# Patient Record
Sex: Female | Born: 1965 | Hispanic: No | Smoking: Current every day smoker
Health system: Southern US, Community
[De-identification: ages and names within clinical notes are randomized; demographics above are authoritative.]

## PROBLEM LIST (undated history)

## (undated) DIAGNOSIS — J449 Chronic obstructive pulmonary disease, unspecified: Secondary | ICD-10-CM

## (undated) DIAGNOSIS — R519 Headache, unspecified: Secondary | ICD-10-CM

## (undated) DIAGNOSIS — F319 Bipolar disorder, unspecified: Secondary | ICD-10-CM

## (undated) DIAGNOSIS — J45909 Unspecified asthma, uncomplicated: Secondary | ICD-10-CM

## (undated) DIAGNOSIS — F410 Panic disorder [episodic paroxysmal anxiety] without agoraphobia: Secondary | ICD-10-CM

## (undated) DIAGNOSIS — K219 Gastro-esophageal reflux disease without esophagitis: Secondary | ICD-10-CM

## (undated) DIAGNOSIS — F329 Major depressive disorder, single episode, unspecified: Secondary | ICD-10-CM

## (undated) DIAGNOSIS — H269 Unspecified cataract: Secondary | ICD-10-CM

## (undated) DIAGNOSIS — F32A Depression, unspecified: Secondary | ICD-10-CM

## (undated) DIAGNOSIS — D219 Benign neoplasm of connective and other soft tissue, unspecified: Secondary | ICD-10-CM

## (undated) DIAGNOSIS — D649 Anemia, unspecified: Secondary | ICD-10-CM

## (undated) DIAGNOSIS — O039 Complete or unspecified spontaneous abortion without complication: Secondary | ICD-10-CM

## (undated) DIAGNOSIS — R000 Tachycardia, unspecified: Secondary | ICD-10-CM

## (undated) DIAGNOSIS — Z5189 Encounter for other specified aftercare: Secondary | ICD-10-CM

## (undated) DIAGNOSIS — R51 Headache: Secondary | ICD-10-CM

## (undated) DIAGNOSIS — G4700 Insomnia, unspecified: Secondary | ICD-10-CM

## (undated) DIAGNOSIS — F419 Anxiety disorder, unspecified: Secondary | ICD-10-CM

## (undated) DIAGNOSIS — M1990 Unspecified osteoarthritis, unspecified site: Secondary | ICD-10-CM

## (undated) HISTORY — PX: DILATION AND CURETTAGE OF UTERUS: SHX78

## (undated) HISTORY — DX: Unspecified osteoarthritis, unspecified site: M19.90

## (undated) HISTORY — DX: Anxiety disorder, unspecified: F41.9

## (undated) HISTORY — DX: Benign neoplasm of connective and other soft tissue, unspecified: D21.9

## (undated) HISTORY — PX: OTHER SURGICAL HISTORY: SHX169

## (undated) HISTORY — DX: Panic disorder (episodic paroxysmal anxiety): F41.0

---

## 1999-04-02 ENCOUNTER — Emergency Department (HOSPITAL_COMMUNITY): Admission: EM | Admit: 1999-04-02 | Discharge: 1999-04-02 | Payer: Self-pay | Admitting: Emergency Medicine

## 2000-12-10 ENCOUNTER — Emergency Department (HOSPITAL_COMMUNITY): Admission: EM | Admit: 2000-12-10 | Discharge: 2000-12-10 | Payer: Self-pay | Admitting: Emergency Medicine

## 2001-03-15 ENCOUNTER — Emergency Department (HOSPITAL_COMMUNITY): Admission: EM | Admit: 2001-03-15 | Discharge: 2001-03-15 | Payer: Self-pay | Admitting: Emergency Medicine

## 2001-03-15 ENCOUNTER — Encounter: Payer: Self-pay | Admitting: Emergency Medicine

## 2001-04-02 ENCOUNTER — Encounter: Payer: Self-pay | Admitting: Internal Medicine

## 2001-04-02 ENCOUNTER — Emergency Department (HOSPITAL_COMMUNITY): Admission: EM | Admit: 2001-04-02 | Discharge: 2001-04-02 | Payer: Self-pay | Admitting: Emergency Medicine

## 2001-04-04 ENCOUNTER — Inpatient Hospital Stay (HOSPITAL_COMMUNITY): Admission: AD | Admit: 2001-04-04 | Discharge: 2001-04-04 | Payer: Self-pay | Admitting: Obstetrics

## 2001-04-04 ENCOUNTER — Encounter: Payer: Self-pay | Admitting: Obstetrics & Gynecology

## 2001-04-05 ENCOUNTER — Inpatient Hospital Stay (HOSPITAL_COMMUNITY): Admission: AD | Admit: 2001-04-05 | Discharge: 2001-04-05 | Payer: Self-pay | Admitting: Obstetrics

## 2002-01-09 ENCOUNTER — Emergency Department (HOSPITAL_COMMUNITY): Admission: EM | Admit: 2002-01-09 | Discharge: 2002-01-10 | Payer: Self-pay | Admitting: *Deleted

## 2002-12-10 ENCOUNTER — Emergency Department (HOSPITAL_COMMUNITY): Admission: EM | Admit: 2002-12-10 | Discharge: 2002-12-10 | Payer: Self-pay | Admitting: Emergency Medicine

## 2003-03-31 ENCOUNTER — Emergency Department (HOSPITAL_COMMUNITY): Admission: EM | Admit: 2003-03-31 | Discharge: 2003-03-31 | Payer: Self-pay | Admitting: Emergency Medicine

## 2003-03-31 ENCOUNTER — Encounter: Payer: Self-pay | Admitting: Emergency Medicine

## 2003-04-21 ENCOUNTER — Emergency Department (HOSPITAL_COMMUNITY): Admission: AC | Admit: 2003-04-21 | Discharge: 2003-04-21 | Payer: Self-pay

## 2003-04-21 ENCOUNTER — Encounter: Payer: Self-pay | Admitting: Emergency Medicine

## 2003-04-21 ENCOUNTER — Emergency Department (HOSPITAL_COMMUNITY): Admission: EM | Admit: 2003-04-21 | Discharge: 2003-04-21 | Payer: Self-pay

## 2003-05-10 ENCOUNTER — Emergency Department (HOSPITAL_COMMUNITY): Admission: EM | Admit: 2003-05-10 | Discharge: 2003-05-10 | Payer: Self-pay | Admitting: Emergency Medicine

## 2003-07-17 ENCOUNTER — Encounter: Admission: RE | Admit: 2003-07-17 | Discharge: 2003-08-20 | Payer: Self-pay | Admitting: Specialist

## 2003-10-20 ENCOUNTER — Inpatient Hospital Stay (HOSPITAL_COMMUNITY): Admission: EM | Admit: 2003-10-20 | Discharge: 2003-10-22 | Payer: Self-pay | Admitting: Psychiatry

## 2004-04-03 ENCOUNTER — Emergency Department (HOSPITAL_COMMUNITY): Admission: EM | Admit: 2004-04-03 | Discharge: 2004-04-03 | Payer: Self-pay | Admitting: Emergency Medicine

## 2005-01-01 ENCOUNTER — Emergency Department (HOSPITAL_COMMUNITY): Admission: EM | Admit: 2005-01-01 | Discharge: 2005-01-01 | Payer: Self-pay | Admitting: Emergency Medicine

## 2006-10-08 ENCOUNTER — Emergency Department (HOSPITAL_COMMUNITY): Admission: EM | Admit: 2006-10-08 | Discharge: 2006-10-09 | Payer: Self-pay | Admitting: Emergency Medicine

## 2007-02-20 ENCOUNTER — Emergency Department (HOSPITAL_COMMUNITY): Admission: EM | Admit: 2007-02-20 | Discharge: 2007-02-20 | Payer: Self-pay | Admitting: Emergency Medicine

## 2007-12-10 ENCOUNTER — Emergency Department (HOSPITAL_COMMUNITY): Admission: EM | Admit: 2007-12-10 | Discharge: 2007-12-10 | Payer: Self-pay | Admitting: Emergency Medicine

## 2008-10-04 ENCOUNTER — Emergency Department (HOSPITAL_COMMUNITY): Admission: EM | Admit: 2008-10-04 | Discharge: 2008-10-04 | Payer: Self-pay | Admitting: Emergency Medicine

## 2009-02-28 ENCOUNTER — Emergency Department (HOSPITAL_COMMUNITY): Admission: EM | Admit: 2009-02-28 | Discharge: 2009-02-28 | Payer: Self-pay | Admitting: Emergency Medicine

## 2009-03-15 ENCOUNTER — Emergency Department (HOSPITAL_COMMUNITY): Admission: EM | Admit: 2009-03-15 | Discharge: 2009-03-15 | Payer: Self-pay | Admitting: Emergency Medicine

## 2009-03-27 ENCOUNTER — Emergency Department (HOSPITAL_COMMUNITY): Admission: EM | Admit: 2009-03-27 | Discharge: 2009-03-28 | Payer: Self-pay | Admitting: Emergency Medicine

## 2009-03-28 ENCOUNTER — Ambulatory Visit: Payer: Self-pay | Admitting: Psychiatry

## 2009-03-28 ENCOUNTER — Inpatient Hospital Stay (HOSPITAL_COMMUNITY): Admission: RE | Admit: 2009-03-28 | Discharge: 2009-03-31 | Payer: Self-pay | Admitting: Psychiatry

## 2009-04-15 ENCOUNTER — Emergency Department (HOSPITAL_COMMUNITY): Admission: EM | Admit: 2009-04-15 | Discharge: 2009-04-15 | Payer: Self-pay | Admitting: Emergency Medicine

## 2009-04-15 ENCOUNTER — Ambulatory Visit: Payer: Self-pay | Admitting: Psychiatry

## 2009-04-15 ENCOUNTER — Inpatient Hospital Stay (HOSPITAL_COMMUNITY): Admission: RE | Admit: 2009-04-15 | Discharge: 2009-04-16 | Payer: Self-pay | Admitting: Psychiatry

## 2009-04-23 ENCOUNTER — Ambulatory Visit: Payer: Self-pay | Admitting: Family Medicine

## 2009-05-08 ENCOUNTER — Ambulatory Visit: Payer: Self-pay | Admitting: *Deleted

## 2009-05-12 ENCOUNTER — Ambulatory Visit (HOSPITAL_COMMUNITY): Admission: RE | Admit: 2009-05-12 | Discharge: 2009-05-12 | Payer: Self-pay | Admitting: Internal Medicine

## 2009-08-26 ENCOUNTER — Emergency Department (HOSPITAL_COMMUNITY): Admission: EM | Admit: 2009-08-26 | Discharge: 2009-08-26 | Payer: Self-pay | Admitting: Emergency Medicine

## 2010-04-29 ENCOUNTER — Emergency Department (HOSPITAL_COMMUNITY): Admission: EM | Admit: 2010-04-29 | Discharge: 2010-04-29 | Payer: Self-pay | Admitting: Emergency Medicine

## 2010-10-14 LAB — URINALYSIS, ROUTINE W REFLEX MICROSCOPIC
Nitrite: NEGATIVE
Specific Gravity, Urine: 1.022 (ref 1.005–1.030)
Urobilinogen, UA: 0.2 mg/dL (ref 0.0–1.0)

## 2010-10-14 LAB — WET PREP, GENITAL
Trich, Wet Prep: NONE SEEN
Yeast Wet Prep HPF POC: NONE SEEN

## 2010-10-27 ENCOUNTER — Emergency Department (HOSPITAL_COMMUNITY)
Admission: EM | Admit: 2010-10-27 | Discharge: 2010-10-27 | Disposition: A | Discharge status: Home / Self Care | Payer: Self-pay | Attending: Emergency Medicine | Admitting: Emergency Medicine

## 2010-10-27 DIAGNOSIS — L259 Unspecified contact dermatitis, unspecified cause: Secondary | ICD-10-CM | POA: Insufficient documentation

## 2010-10-30 LAB — RAPID URINE DRUG SCREEN, HOSP PERFORMED
Amphetamines: NOT DETECTED
Amphetamines: NOT DETECTED
Barbiturates: NOT DETECTED
Barbiturates: NOT DETECTED
Benzodiazepines: POSITIVE — AB
Cocaine: NOT DETECTED
Opiates: NOT DETECTED
Tetrahydrocannabinol: NOT DETECTED
Tetrahydrocannabinol: NOT DETECTED

## 2010-10-30 LAB — SALICYLATE LEVEL: Salicylate Lvl: <4 mg/dL (ref 2.8–20.0)

## 2010-10-30 LAB — COMPREHENSIVE METABOLIC PANEL
ALT: 38 U/L — ABNORMAL HIGH (ref 0–35)
Albumin: 4 g/dL (ref 3.5–5.2)
Alkaline Phosphatase: 78 U/L (ref 39–117)
BUN: 5 mg/dL — ABNORMAL LOW (ref 6–23)
Chloride: 108 mEq/L (ref 96–112)
Glucose, Bld: 107 mg/dL — ABNORMAL HIGH (ref 70–99)
Potassium: 3.5 mEq/L (ref 3.5–5.1)
Sodium: 139 mEq/L (ref 135–145)
Total Bilirubin: 0.4 mg/dL (ref 0.3–1.2)

## 2010-10-30 LAB — ETHANOL
Alcohol, Ethyl (B): 278 mg/dL — ABNORMAL HIGH (ref 0–10)
Alcohol, Ethyl (B): 341 mg/dL — ABNORMAL HIGH (ref 0–10)

## 2010-10-30 LAB — ACETAMINOPHEN LEVEL: Acetaminophen (Tylenol), Serum: <10 ug/mL — ABNORMAL LOW (ref 10–30)

## 2010-10-30 LAB — POCT I-STAT, CHEM 8
BUN: 6 mg/dL (ref 6–23)
Calcium, Ion: 0.93 mmol/L — ABNORMAL LOW (ref 1.12–1.32)
Creatinine, Ser: 0.8 mg/dL (ref 0.4–1.2)
Hemoglobin: 15.6 g/dL — ABNORMAL HIGH (ref 12.0–15.0)
Sodium: 147 mEq/L — ABNORMAL HIGH (ref 135–145)
TCO2: 25 mmol/L (ref 0–100)

## 2010-10-30 LAB — URINALYSIS, ROUTINE W REFLEX MICROSCOPIC
Bilirubin Urine: NEGATIVE
Ketones, ur: NEGATIVE mg/dL
Nitrite: NEGATIVE
Nitrite: NEGATIVE
Protein, ur: NEGATIVE mg/dL
Protein, ur: NEGATIVE mg/dL
Specific Gravity, Urine: 1.005 (ref 1.005–1.030)
Urobilinogen, UA: 0.2 mg/dL (ref 0.0–1.0)

## 2010-10-30 LAB — CBC
HCT: 41.2 % (ref 36.0–46.0)
Hemoglobin: 13.4 g/dL (ref 12.0–15.0)
Platelets: 452 10*3/uL — ABNORMAL HIGH (ref 150–400)
WBC: 11.5 10*3/uL — ABNORMAL HIGH (ref 4.0–10.5)

## 2010-10-30 LAB — URINE MICROSCOPIC-ADD ON

## 2010-10-30 LAB — DIFFERENTIAL
Basophils Absolute: 0.1 10*3/uL (ref 0.0–0.1)
Eosinophils Absolute: 0.1 10*3/uL (ref 0.0–0.7)
Lymphocytes Relative: 24 % (ref 12–46)
Monocytes Absolute: 0.9 10*3/uL (ref 0.1–1.0)
Neutro Abs: 7.6 10*3/uL (ref 1.7–7.7)

## 2010-10-30 LAB — POCT PREGNANCY, URINE: Preg Test, Ur: NEGATIVE

## 2010-10-31 LAB — URINALYSIS, ROUTINE W REFLEX MICROSCOPIC
Glucose, UA: NEGATIVE mg/dL
Hgb urine dipstick: NEGATIVE
Ketones, ur: NEGATIVE mg/dL
Nitrite: NEGATIVE
Protein, ur: NEGATIVE mg/dL
Specific Gravity, Urine: 1.006 (ref 1.005–1.030)
Urobilinogen, UA: 0.2 mg/dL (ref 0.0–1.0)
Urobilinogen, UA: 0.2 mg/dL (ref 0.0–1.0)

## 2010-10-31 LAB — POCT I-STAT, CHEM 8
BUN: 4 mg/dL — ABNORMAL LOW (ref 6–23)
Calcium, Ion: 1 mmol/L — ABNORMAL LOW (ref 1.12–1.32)
Calcium, Ion: 0.95 mmol/L — ABNORMAL LOW (ref 1.12–1.32)
Creatinine, Ser: 0.6 mg/dL (ref 0.4–1.2)
Glucose, Bld: 111 mg/dL — ABNORMAL HIGH (ref 70–99)
HCT: 44 % (ref 36.0–46.0)
Hemoglobin: 15 g/dL (ref 12.0–15.0)
Hemoglobin: 15 g/dL (ref 12.0–15.0)
Sodium: 145 mEq/L (ref 135–145)
TCO2: 20 mmol/L (ref 0–100)
TCO2: 18 mmol/L (ref 0–100)

## 2010-10-31 LAB — RAPID URINE DRUG SCREEN, HOSP PERFORMED
Amphetamines: NOT DETECTED
Amphetamines: NOT DETECTED
Barbiturates: NOT DETECTED
Benzodiazepines: POSITIVE — AB
Benzodiazepines: POSITIVE — AB
Tetrahydrocannabinol: NOT DETECTED

## 2010-10-31 LAB — URINE MICROSCOPIC-ADD ON

## 2010-10-31 LAB — DIFFERENTIAL
Basophils Absolute: 0.2 10*3/uL — ABNORMAL HIGH (ref 0.0–0.1)
Basophils Relative: 2 % — ABNORMAL HIGH (ref 0–1)
Eosinophils Absolute: 0.1 10*3/uL (ref 0.0–0.7)
Eosinophils Absolute: 0.2 10*3/uL (ref 0.0–0.7)
Eosinophils Relative: 3 % (ref 0–5)
Lymphocytes Relative: 63 % — ABNORMAL HIGH (ref 12–46)
Lymphs Abs: 3.5 10*3/uL (ref 0.7–4.0)
Monocytes Absolute: 0.4 10*3/uL (ref 0.1–1.0)
Neutrophils Relative %: 36 % — ABNORMAL LOW (ref 43–77)
Neutrophils Relative %: 26 % — ABNORMAL LOW (ref 43–77)

## 2010-10-31 LAB — CBC
HCT: 40.2 % (ref 36.0–46.0)
MCHC: 33.3 g/dL (ref 30.0–36.0)
MCHC: 31.8 g/dL (ref 30.0–36.0)
MCV: 90.4 fL (ref 78.0–100.0)
Platelets: 470 10*3/uL — ABNORMAL HIGH (ref 150–400)
RBC: 4.61 MIL/uL (ref 3.87–5.11)
RDW: 22.8 % — ABNORMAL HIGH (ref 11.5–15.5)
WBC: 6.5 10*3/uL (ref 4.0–10.5)

## 2010-10-31 LAB — POCT CARDIAC MARKERS

## 2010-10-31 LAB — POCT PREGNANCY, URINE: Preg Test, Ur: NEGATIVE

## 2010-11-05 LAB — POCT CARDIAC MARKERS

## 2010-11-05 LAB — DIFFERENTIAL
Basophils Absolute: 0 10*3/uL (ref 0.0–0.1)
Basophils Relative: 1 % (ref 0–1)
Eosinophils Absolute: 0.1 10*3/uL (ref 0.0–0.7)
Eosinophils Relative: 2 % (ref 0–5)
Lymphocytes Relative: 30 % (ref 12–46)

## 2010-11-05 LAB — COMPREHENSIVE METABOLIC PANEL
ALT: 132 U/L — ABNORMAL HIGH (ref 0–35)
AST: 157 U/L — ABNORMAL HIGH (ref 0–37)
Alkaline Phosphatase: 58 U/L (ref 39–117)
CO2: 15 mEq/L — ABNORMAL LOW (ref 19–32)
Chloride: 106 mEq/L (ref 96–112)
Creatinine, Ser: 0.64 mg/dL (ref 0.4–1.2)
GFR calc Af Amer: >60 mL/min (ref >60)
GFR calc non Af Amer: >60 mL/min (ref >60)
Potassium: 3.6 mEq/L (ref 3.5–5.1)
Total Bilirubin: 0.6 mg/dL (ref 0.3–1.2)

## 2010-11-05 LAB — CBC
MCV: 86.9 fL (ref 78.0–100.0)
RBC: 4.54 MIL/uL (ref 3.87–5.11)
WBC: 6.5 10*3/uL (ref 4.0–10.5)

## 2010-12-08 NOTE — Consult Note (Signed)
Leslie [name], GOWENS NO.:  [id number]   MEDICAL RECORD NO.:  [id number]          PATIENT TYPE:  EMS   LOCATION:  ED                           FACILITY:  [facility]   PHYSICIAN:  Angelia Mould. Derrell Lolling, M.D.DATE OF BIRTH:  08/15/1965   DATE OF CONSULTATION:  12/10/2007  DATE OF DISCHARGE:                                 CONSULTATION   EMERGENCY DEPARTMENT CONSULTATION AND PROCEDURE NOTE:   CHIEF COMPLAINT:  Rectal pain and swelling.   HISTORY OF PRESENT ILLNESS:  This is a 45 year old female without any  prior history of rectal problems.  She presents with a 2 to 3-day  history of progressive pain and swelling in the perirectal area.  No  fever or chills.  She did vomit yesterday once but not today.  She has  been able to eat today. Her last bowel movement was today and was  normal.  She came to the emergency room.  The emergency department  physician called and said he thought it was a perirectal abscess and  asked me to see the patient.  The patient has not had any bleeding   PAST HISTORY:  She has had five pregnancies, three deliveries and two  miscarriages. Denies diabetes, hypertension or cancer.  She does have  mild asthma.   MEDICATIONS:  Albuterol inhaler p.r.n., Aleve, Xanax.   DRUG ALLERGIES:  None known.   SOCIAL HISTORY:  The patient is single, lives in [place].  There is a  female friend with her who states he is her fiance.  She has three grown  children.  She is unemployed.  She smokes 1-1/2 packs of cigarettes per  day.  She drinks alcohol occasionally and says she had three mixed  drinks earlier today about 3 hours ago.   FAMILY HISTORY:  Mother deceased, had lung cancer and hypertension.  Father, living, has some kind of a cancer and hypertension.  She has  seven siblings, one who has cardiomyopathy needing a heart transplant;  one sister has hypertension; one brother has hypertension.   REVIEW OF SYSTEMS:  All systems reviewed and documented on  the chart.  Noncontributory except as above.   PHYSICAL EXAMINATION:  GENERAL:  A middle-age female in mild distress.  VITAL SIGNS:  Temperature 98.3, pulse 95, respirations 18, blood  pressure 115/70.  SKIN:  She has a fair amount of make up on. No obvious skin lesions.  EYES:  Sclerae clear.  Extraocular movements intact.  ENT:  Ears, mouth, throat, nose, lips tongue and oropharynx without  gross lesions.  NECK:  Supple, nontender.  No mass.  No jugular distention.  LUNGS:  Clear to auscultation.  No wheezing.  No chest wall tenderness.  HEART:  Regular rate and rhythm.  No murmur.  Radial and femoral pulses  are palpable.  BREASTS:  Not examined.  ABDOMEN:  Soft, nontender.  I do not feel any mass.  I do not feel any  hernia.  I do not see any scars.  RECTAL:  The patient has a thrombosed external hemorrhoid on the right  lateral side.  Thrombosis is close  to the surface but is not actively  bleeding at this time.  There is no cellulitis.  I do not see sign of  any infection or purulence anywhere.  EXTREMITIES:  She moves all four extremities well without pain or  deformity.  NEUROLOGIC:  No gross motor or sensory deficits noted.   PROCEDURE NOTE:  With registered nurse in attendance as well as her  fiance in attendance, the perianal area was prepped with alcohol,  anesthetized with 1% Xylocaine with epinephrine.  I then conservatively  excised the thrombosed external hemorrhoid in the right lateral  position.  She tolerated this well.  Hemostasis was very good.  Bulky  absorbed gauze and bandages were placed.   ASSESSMENT:  Thrombosed external hemorrhoid, right lateral, excised  today.   PLAN:  The patient was given extensive instructions to take warm tub  baths three times a day, daily stool softeners, use of moist baby wipes,  use of dry gauze bandage. Expect complete healing in 10-14 days and to  call me if there are any problems.  She was asked to use Advil or  Tylenol  for pain.  She expressed understanding of this, and all of her  questions were answered.      Angelia Mould. Derrell Lolling, M.D.  Electronically Signed     HMI/MEDQ  D:  12/10/2007  T:  12/10/2007  Job:  [id number]   cc:   Lorre Nick, M.D.  Fax: [phone]
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
## 2011-04-21 LAB — DIFFERENTIAL
Basophils Relative: 1
Eosinophils Absolute: 0.1
Lymphs Abs: 3
Monocytes Absolute: 0.5
Monocytes Relative: 7
Neutrophils Relative %: 48

## 2011-04-21 LAB — CBC
HCT: 35.4 — ABNORMAL LOW
Hemoglobin: 12.1
MCHC: 34.3
MCV: 86.8
Platelets: 242
RBC: 4.08
RDW: 20.1 — ABNORMAL HIGH
WBC: 7

## 2011-04-21 LAB — POCT I-STAT, CHEM 8
Calcium, Ion: 1.06 — ABNORMAL LOW
Creatinine, Ser: 0.7
Glucose, Bld: 90
HCT: 39
Hemoglobin: 13.3
Potassium: 3.7

## 2011-12-01 ENCOUNTER — Emergency Department (HOSPITAL_COMMUNITY)
Admission: EM | Admit: 2011-12-01 | Discharge: 2011-12-01 | Disposition: A | Discharge status: Home / Self Care | Payer: Medicaid Other | Source: Home / Self Care | Attending: Emergency Medicine | Admitting: Emergency Medicine

## 2011-12-01 ENCOUNTER — Emergency Department (INDEPENDENT_AMBULATORY_CARE_PROVIDER_SITE_OTHER): Payer: Medicaid Other

## 2011-12-01 ENCOUNTER — Encounter (HOSPITAL_COMMUNITY): Payer: Self-pay

## 2011-12-01 DIAGNOSIS — J40 Bronchitis, not specified as acute or chronic: Secondary | ICD-10-CM

## 2011-12-01 MED ORDER — AZITHROMYCIN 250 MG PO TABS: 250.0000 mg | ORAL_TABLET | Freq: Every day | ORAL | Status: AC

## 2011-12-01 MED ORDER — ALBUTEROL SULFATE HFA 108 (90 BASE) MCG/ACT IN AERS: 1.0000 | INHALATION_SPRAY | Freq: Four times a day (QID) | RESPIRATORY_TRACT | Status: DC | PRN

## 2011-12-01 NOTE — ED Notes (Signed)
C/o cough w congestion, worse at night (hard to sleep) for past 2 weeks; yellow secretions; NAD

## 2011-12-01 NOTE — ED Provider Notes (Signed)
Medical screening examination/treatment/procedure(s) were performed by non-physician practitioner and as supervising physician I was immediately available for consultation/collaboration.  Tanica Gaige, M.D.   Sylvia Helms C Joella Saefong, MD 12/01/11 2242 

## 2011-12-01 NOTE — Discharge Instructions (Signed)
Bronchitis Bronchitis is a problem of the air tubes leading to your lungs. This problem makes it hard for air to get in and out of the lungs. You may cough a lot because your air tubes are narrow. Going without care can cause lasting (chronic) bronchitis. HOME CARE   Drink enough fluids to keep your pee (urine) clear or pale yellow.   Use a cool mist humidifier.   Quit smoking if you smoke. If you keep smoking, the bronchitis might not get better.   Only take medicine as told by your doctor.  GET HELP RIGHT AWAY IF:   Coughing keeps you awake.   You start to wheeze.   You become more sick or weak.   You have a hard time breathing or get short of breath.   You cough up blood.   Coughing lasts more than 2 weeks.   You have a fever.   Your baby is older than 3 months with a rectal temperature of 102 F (38.9 C) or higher.   Your baby is 3 months old or younger with a rectal temperature of 100.4 F (38 C) or higher.  MAKE SURE YOU:  Understand these instructions.   Will watch your condition.   Will get help right away if you are not doing well or get worse.  Document Released: 12/29/2007 Document Revised: 07/01/2011 Document Reviewed: 06/13/2009 Exit[employer] Patient Information 2012 [employer]. 

## 2011-12-01 NOTE — ED Provider Notes (Signed)
History     CSN: [id number]  Arrival date & time 12/01/11  1400   First MD Initiated Contact with Patient 12/01/11 1456      Chief Complaint  Patient presents with  . Cough    (Consider location/radiation/quality/duration/timing/severity/associated sxs/prior treatment) Patient is a 46 y.o. female presenting with cough. The history is provided by the patient. No language interpreter was used.  Cough This is a new problem. The current episode started more than 1 week ago. The problem occurs constantly. The problem has been gradually worsening. The cough is productive of sputum. There has been no fever. Associated symptoms include shortness of breath. She has tried nothing for the symptoms. The treatment provided moderate relief. She is a smoker. Her past medical history is significant for bronchitis and asthma. Her past medical history does not include pneumonia.   Pt is out of her inhaler,   Pt has no local md History reviewed. No pertinent past medical history.  History reviewed. No pertinent past surgical history.  History reviewed. No pertinent family history.  History  Substance Use Topics  . Smoking status: Not on file  . Smokeless tobacco: Not on file  . Alcohol Use: Not on file    OB History    Grav Para Term Preterm Abortions TAB SAB Ect Mult Living                  Review of Systems  Respiratory: Positive for cough and shortness of breath.   All other systems reviewed and are negative.    Allergies  Review of patient's allergies indicates not on file.  Home Medications  No current outpatient prescriptions on file.  BP 142/94  Pulse 90  Temp(Src) 99.1 F (37.3 C) (Oral)  Resp 20  SpO2 98%  Physical Exam  Vitals reviewed. Constitutional: She is oriented to person, place, and time. She appears well-developed and well-nourished.  HENT:  Head: Normocephalic and atraumatic.  Left Ear: External ear normal.  Nose: Nose normal.  Mouth/Throat: Oropharynx  is clear and moist.  Eyes: Conjunctivae and EOM are normal. Pupils are equal, round, and reactive to light.  Neck: Normal range of motion. Neck supple.  Cardiovascular: Normal rate and normal heart sounds.   Pulmonary/Chest: Effort normal.  Abdominal: Soft.  Musculoskeletal: Normal range of motion.  Neurological: She is alert and oriented to person, place, and time.  Skin: Skin is warm.  Psychiatric: She has a normal mood and affect.    ED Course  Procedures (including critical care time)  Labs Reviewed - No data to display No results found.   No diagnosis found.    MDM    Chest xray no pneumonia      Lonia Skinner [place], [place] 12/01/11 [phone]

## 2012-02-19 ENCOUNTER — Emergency Department (HOSPITAL_COMMUNITY)
Admission: EM | Admit: 2012-02-19 | Discharge: 2012-02-19 | Disposition: A | Discharge status: Home / Self Care | Payer: Medicaid Other | Attending: Emergency Medicine | Admitting: Emergency Medicine

## 2012-02-19 ENCOUNTER — Encounter (HOSPITAL_COMMUNITY): Payer: Self-pay | Admitting: *Deleted

## 2012-02-19 DIAGNOSIS — R509 Fever, unspecified: Secondary | ICD-10-CM | POA: Insufficient documentation

## 2012-02-19 DIAGNOSIS — R000 Tachycardia, unspecified: Secondary | ICD-10-CM | POA: Insufficient documentation

## 2012-02-19 DIAGNOSIS — K08109 Complete loss of teeth, unspecified cause, unspecified class: Secondary | ICD-10-CM | POA: Insufficient documentation

## 2012-02-19 DIAGNOSIS — R221 Localized swelling, mass and lump, neck: Secondary | ICD-10-CM | POA: Insufficient documentation

## 2012-02-19 DIAGNOSIS — Z79899 Other long term (current) drug therapy: Secondary | ICD-10-CM | POA: Insufficient documentation

## 2012-02-19 DIAGNOSIS — K0889 Other specified disorders of teeth and supporting structures: Secondary | ICD-10-CM

## 2012-02-19 DIAGNOSIS — R220 Localized swelling, mass and lump, head: Secondary | ICD-10-CM | POA: Insufficient documentation

## 2012-02-19 DIAGNOSIS — K089 Disorder of teeth and supporting structures, unspecified: Secondary | ICD-10-CM | POA: Insufficient documentation

## 2012-02-19 MED ORDER — TRAMADOL HCL 50 MG PO TABS: 50.0000 mg | ORAL_TABLET | Freq: Four times a day (QID) | ORAL | Status: AC | PRN

## 2012-02-19 NOTE — Discharge Instructions (Signed)
As discussed, it is very important that you follow up with your dentist as soon as possible.  Please return for any concerning changes in your condition.

## 2012-02-19 NOTE — ED Provider Notes (Signed)
History   This chart was scribed for Gerhard Munch, MD by Charolett Bumpers . The patient was seen in room TR09C/TR09C. Patient's care was started at 12:01.    CSN: [id number]  Arrival date & time 02/19/12  1114   First MD Initiated Contact with Patient 02/19/12 1201      Chief Complaint  Patient presents with  . Dental Pain    (Consider location/radiation/quality/duration/timing/severity/associated sxs/prior treatment) HPI Leslie [name] is a 46 y.o. female who presents to the Emergency Department complaining of constant, moderate left lower dental pain with associated swelling since 02/08/12. Pt states that on the 16th, she had 3 teeth extracted, her wisdom teeth. Pt states that her dental pain is radiating to her eyes. Pt also reports associated chills, fevers, and n/v. Temperature here in ED is 99.3. Pt states that her symptoms are aggravated with opening her mouth and biting down. Pt states that her symptoms are relieved by nothing. Pt denies any prior medical hx and states that she is otherwise healthy. Pt states that she has an appointment next week with her dentist.   History reviewed. No pertinent past medical history.  History reviewed. No pertinent past surgical history.  History reviewed. No pertinent family history.  History  Substance Use Topics  . Smoking status: Current Everyday Smoker -- 1.0 packs/day    Types: Cigarettes  . Smokeless tobacco: Not on file  . Alcohol Use: No    OB History    Grav Para Term Preterm Abortions TAB SAB Ect Mult Living                  Review of Systems  Constitutional:       Per HPI, otherwise negative  HENT: Positive for dental problem.        Per HPI, otherwise negative  Eyes: Negative.   Respiratory:       Per HPI, otherwise negative  Cardiovascular:       Per HPI, otherwise negative  Gastrointestinal: Negative for vomiting.  Genitourinary: Negative.   Musculoskeletal:       Per HPI, otherwise negative  Skin:  Negative.   Neurological: Negative for syncope.    Allergies  Review of patient's allergies indicates no known allergies.  Home Medications   Current Outpatient Rx  Name Route Sig Dispense Refill  . ALBUTEROL SULFATE HFA 108 (90 BASE) MCG/ACT IN AERS Inhalation Inhale 1-2 puffs into the lungs every 6 (six) hours as needed for wheezing. 1 Inhaler 0  . AMOXICILLIN-POT CLAVULANATE 875-125 MG PO TABS Oral Take 1 tablet by mouth 2 (two) times daily. For 10 days. Started on 02/08/12    . IBUPROFEN 200 MG PO TABS Oral Take 200 mg by mouth every 6 (six) hours as needed. For pain      BP 132/88  Pulse 118  Temp 99.3 F (37.4 C) (Oral)  Resp 16  SpO2 98%  LMP 01/30/2012  Physical Exam  Nursing note and vitals reviewed. Constitutional: She is oriented to person, place, and time. She appears well-developed and well-nourished. No distress.  HENT:  Head: Normocephalic and atraumatic.  Mouth/Throat: Uvula is midline, oropharynx is clear and moist and mucous membranes are normal. No uvula swelling. No oropharyngeal exudate or posterior oropharyngeal erythema.       Top right and left gumline appear to be healing normally. Tenderness and swelling to bottom left gumline with no fluctuance or drainage. Some erythema noted to gumline.   Eyes: Conjunctivae and EOM are normal.  Cardiovascular:  Normal rate, regular rhythm and normal heart sounds.   Pulmonary/Chest: Effort normal and breath sounds normal. No stridor. No respiratory distress. She has no wheezes.  Abdominal: She exhibits no distension.  Musculoskeletal: She exhibits no edema.  Neurological: She is alert and oriented to person, place, and time. No cranial nerve deficit.  Skin: Skin is warm and dry.  Psychiatric: She has a normal mood and affect.    ED Course  Procedures (including critical care time)    COORDINATION OF CARE:  12:08-Discussed planned course of treatment with the patient including pain management, continuing with  her Augmentin and f/u with dentist, who is agreeable at this time.     Labs Reviewed - No data to display No results found.   No diagnosis found.    MDM  I personally performed the services described in this documentation, which was scribed in my presence. The recorded information has been reviewed and considered.  This generally well female presents one week after having her wisdom teeth removed, now with concerns of ongoing dental pain.  On exam the patient is in no distress, though she is mildly tachycardic.  The patient has swelling about the left posterior inferior teeth, without frank.  Once, drainage or notable erythema.  Given the absence of a posterior oral pharyngeal changes, the patient's appropriate respiratory status, the absence of fever, there is low suspicion of acute ongoing systemic infection or imminent respiratory compromise.  The patient is taking antibiotics.  She was also provided analgesics.  She was discharged in stable condition to follow up with her dentist as soon as possible for additional evaluation and management.  Gerhard Munch, MD 02/19/12 1228
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
## 2012-05-16 ENCOUNTER — Emergency Department (HOSPITAL_COMMUNITY): Payer: Federal, State, Local not specified - Other

## 2012-05-16 ENCOUNTER — Emergency Department (HOSPITAL_COMMUNITY)
Admission: EM | Admit: 2012-05-16 | Discharge: 2012-05-17 | Disposition: A | Discharge status: Home / Self Care | Payer: Federal, State, Local not specified - Other | Attending: Emergency Medicine | Admitting: Emergency Medicine

## 2012-05-16 DIAGNOSIS — Y929 Unspecified place or not applicable: Secondary | ICD-10-CM | POA: Insufficient documentation

## 2012-05-16 DIAGNOSIS — H02209 Unspecified lagophthalmos unspecified eye, unspecified eyelid: Secondary | ICD-10-CM | POA: Insufficient documentation

## 2012-05-16 DIAGNOSIS — F1310 Sedative, hypnotic or anxiolytic abuse, uncomplicated: Secondary | ICD-10-CM

## 2012-05-16 DIAGNOSIS — F329 Major depressive disorder, single episode, unspecified: Secondary | ICD-10-CM | POA: Insufficient documentation

## 2012-05-16 DIAGNOSIS — F1010 Alcohol abuse, uncomplicated: Secondary | ICD-10-CM | POA: Insufficient documentation

## 2012-05-16 DIAGNOSIS — Y9389 Activity, other specified: Secondary | ICD-10-CM | POA: Insufficient documentation

## 2012-05-16 DIAGNOSIS — F3289 Other specified depressive episodes: Secondary | ICD-10-CM | POA: Insufficient documentation

## 2012-05-16 DIAGNOSIS — F32A Depression, unspecified: Secondary | ICD-10-CM

## 2012-05-16 DIAGNOSIS — H11002 Unspecified pterygium of left eye: Secondary | ICD-10-CM

## 2012-05-16 DIAGNOSIS — Z79899 Other long term (current) drug therapy: Secondary | ICD-10-CM | POA: Insufficient documentation

## 2012-05-16 DIAGNOSIS — F17200 Nicotine dependence, unspecified, uncomplicated: Secondary | ICD-10-CM | POA: Insufficient documentation

## 2012-05-16 DIAGNOSIS — T424X1A Poisoning by benzodiazepines, accidental (unintentional), initial encounter: Secondary | ICD-10-CM | POA: Insufficient documentation

## 2012-05-16 DIAGNOSIS — F1910 Other psychoactive substance abuse, uncomplicated: Secondary | ICD-10-CM | POA: Insufficient documentation

## 2012-05-16 DIAGNOSIS — F10929 Alcohol use, unspecified with intoxication, unspecified: Secondary | ICD-10-CM

## 2012-05-16 HISTORY — DX: Depression, unspecified: F32.A

## 2012-05-16 HISTORY — DX: Unspecified asthma, uncomplicated: J45.909

## 2012-05-16 HISTORY — DX: Major depressive disorder, single episode, unspecified: F32.9

## 2012-05-16 LAB — CBC WITH DIFFERENTIAL/PLATELET
Basophils Absolute: 0.1 10*3/uL (ref 0.0–0.1)
Basophils Relative: 1 % (ref 0–1)
Eosinophils Absolute: 0.4 10*3/uL (ref 0.0–0.7)
HCT: 43.3 % (ref 36.0–46.0)
Hemoglobin: 14.2 g/dL (ref 12.0–15.0)
MCH: 28.3 pg (ref 26.0–34.0)
MCHC: 32.8 g/dL (ref 30.0–36.0)
Monocytes Absolute: 0.6 10*3/uL (ref 0.1–1.0)
Monocytes Relative: 7 % (ref 3–12)
Neutrophils Relative %: 44 % (ref 43–77)
RDW: 17.8 % — ABNORMAL HIGH (ref 11.5–15.5)

## 2012-05-16 LAB — COMPREHENSIVE METABOLIC PANEL
AST: 21 U/L (ref 0–37)
Albumin: 3.5 g/dL (ref 3.5–5.2)
BUN: 8 mg/dL (ref 6–23)
Calcium: 8.5 mg/dL (ref 8.4–10.5)
Creatinine, Ser: 0.57 mg/dL (ref 0.50–1.10)
Total Protein: 7.2 g/dL (ref 6.0–8.3)

## 2012-05-16 LAB — RAPID URINE DRUG SCREEN, HOSP PERFORMED
Amphetamines: NOT DETECTED
Benzodiazepines: NOT DETECTED
Cocaine: NOT DETECTED
Opiates: NOT DETECTED
Tetrahydrocannabinol: NOT DETECTED

## 2012-05-16 MED ORDER — ALBUTEROL SULFATE (5 MG/ML) 0.5% IN NEBU
2.5000 mg | INHALATION_SOLUTION | RESPIRATORY_TRACT | Status: DC | PRN
  Administered 2012-05-17 (×3): 2.5 mg via RESPIRATORY_TRACT
  Filled 2012-05-16 (×3): qty 0.5

## 2012-05-16 NOTE — ED Notes (Signed)
Sitter at bedside at this time 

## 2012-05-16 NOTE — ED Notes (Signed)
Pt has been drinking all day (1/5 liquor + beers) and took 5 xanax. Pt says that she "does not like" herself anymore. Pt fell from standing to floor, witnessed by PD. Denies pain/tenderness, immobilized d/t altered mental status. CP x3 weeks (worse with cough, +smoker) ST on the monitor.  132/88. 114. IV established prior to arrival

## 2012-05-17 ENCOUNTER — Encounter (HOSPITAL_COMMUNITY): Payer: Self-pay | Admitting: *Deleted

## 2012-05-17 MED ORDER — GABAPENTIN 300 MG PO CAPS: 300.0000 mg | ORAL_CAPSULE | Freq: Three times a day (TID) | ORAL | Status: DC

## 2012-05-17 MED ORDER — ALUM & MAG HYDROXIDE-SIMETH 200-200-20 MG/5ML PO SUSP: 30.0000 mL | ORAL | Status: DC | PRN

## 2012-05-17 MED ORDER — ALBUTEROL SULFATE HFA 108 (90 BASE) MCG/ACT IN AERS: 1.0000 | INHALATION_SPRAY | Freq: Four times a day (QID) | RESPIRATORY_TRACT | Status: DC | PRN

## 2012-05-17 MED ORDER — ONDANSETRON HCL 4 MG PO TABS: 4.0000 mg | ORAL_TABLET | Freq: Three times a day (TID) | ORAL | Status: DC | PRN

## 2012-05-17 MED ORDER — IBUPROFEN 200 MG PO TABS: 200.0000 mg | ORAL_TABLET | Freq: Four times a day (QID) | ORAL | Status: DC | PRN

## 2012-05-17 MED ORDER — SODIUM CHLORIDE 0.9 % IV SOLN
Freq: Once | INTRAVENOUS | Status: AC
  Administered 2012-05-17: 02:00:00 via INTRAVENOUS

## 2012-05-17 MED ORDER — NICOTINE 21 MG/24HR TD PT24
21.0000 mg | MEDICATED_PATCH | Freq: Every day | TRANSDERMAL | Status: DC
  Administered 2012-05-17: 21 mg via TRANSDERMAL
  Filled 2012-05-17: qty 1

## 2012-05-17 MED ORDER — SODIUM CHLORIDE 0.9 % IV BOLUS (SEPSIS)
1000.0000 mL | Freq: Once | INTRAVENOUS | Status: AC
  Administered 2012-05-17: 1000 mL via INTRAVENOUS

## 2012-05-17 NOTE — ED Notes (Signed)
telepsych received and copy given to md

## 2012-05-17 NOTE — ED Provider Notes (Signed)
History     CSN: [id number]  Arrival date & time 05/16/12  2215   First MD Initiated Contact with Patient 05/16/12 2252      Chief Complaint  Patient presents with  . Drug Overdose    (Consider location/radiation/quality/duration/timing/severity/associated sxs/prior treatment) HPI 46 year old female presents emergency apartment via EMS after calling 911 for help. EMS reports witnessed fall upon their arrival. Patient reports she's been depressed today, has been drinking heavily. She thinks she drank several shots and a sixpack of beer. She took an estimated 5 Xanax. She has not prescribed Xanax, she bought it off the street. She reports this was not a suicide attempt, she was "just trying to get away from it all" she has history of suicide attempts in the past, hospitalized in 2010 for depression. She reports she had been on Neurontin for nerve pain and depression, she's been off of that for the last 2 months. She is not currently seeing a psychiatrist. Patient reports when EMS arrived she felt very short of breath and then fell. She has a history of asthma. She smokes one pack a day.  No past medical history on file.  No past surgical history on file.  No family history on file.  History  Substance Use Topics  . Smoking status: Current Every Day Smoker -- 1.0 packs/day    Types: Cigarettes  . Smokeless tobacco: Not on file  . Alcohol Use: No    OB History    Grav Para Term Preterm Abortions TAB SAB Ect Mult Living                  Review of Systems  Unable to perform ROS: Mental status change  alcohol intoxication  Allergies  Review of patient's allergies indicates no known allergies.  Home Medications   Current Outpatient Rx  Name Route Sig Dispense Refill  . ALBUTEROL SULFATE HFA 108 (90 BASE) MCG/ACT IN AERS Inhalation Inhale 1-2 puffs into the lungs every 6 (six) hours as needed for wheezing. 1 Inhaler 0  . IBUPROFEN 200 MG PO TABS Oral Take 200 mg by mouth  every 6 (six) hours as needed. For pain      BP 133/91  Pulse 109  Temp 98.2 F (36.8 C) (Oral)  Resp 27  SpO2 93%  LMP 04/21/2012  Physical Exam  Nursing note and vitals reviewed. Constitutional: She is oriented to person, place, and time. She appears well-developed and well-nourished. She appears distressed.       Tearful  HENT:  Head: Normocephalic and atraumatic.  Right Ear: External ear normal.  Left Ear: External ear normal.  Nose: Nose normal.  Mouth/Throat: Oropharynx is clear and moist.  Eyes: EOM are normal. Pupils are equal, round, and reactive to light.       Patient noted to have growth on lateral aspect of conjunctival crossing into visual access, reports presence for greater than year, worse over the last month.  No pain, only some decreased vision, suspect pterygium  Neck: Normal range of motion. Neck supple. No JVD present. No tracheal deviation present. No thyromegaly present.       C-collar in place. No step-off or crepitus noted on palpation of posterior cervical spine  Cardiovascular: Normal rate, regular rhythm, normal heart sounds and intact distal pulses.  Exam reveals no gallop and no friction rub.   No murmur heard. Pulmonary/Chest: Effort normal. No stridor. No respiratory distress. She has wheezes. She has no rales. She exhibits no tenderness.  Abdominal: Soft.  Bowel sounds are normal. She exhibits no distension and no mass. There is no tenderness. There is no rebound and no guarding.  Musculoskeletal: Normal range of motion. She exhibits no edema and no tenderness.  Lymphadenopathy:    She has no cervical adenopathy.  Neurological: She is oriented to person, place, and time. No cranial nerve deficit. She exhibits normal muscle tone. Coordination normal.       She appears intoxicated with slurring of words, nystagmus present  Skin: Skin is warm and dry. No rash noted. No erythema. No pallor.  Psychiatric:       Flat affect, depressed mood    ED  Course  Procedures (including critical care time)  Labs Reviewed  CBC WITH DIFFERENTIAL - Abnormal; Notable for the following:    RDW 17.8 (*)     Lymphs Abs 4.1 (*)     All other components within normal limits  COMPREHENSIVE METABOLIC PANEL - Abnormal; Notable for the following:    Total Bilirubin 0.1 (*)     All other components within normal limits  ETHANOL - Abnormal; Notable for the following:    Alcohol, Ethyl (B) 313 (*)     All other components within normal limits  URINE RAPID DRUG SCREEN (HOSP PERFORMED)   Ct Head Wo Contrast  05/17/2012  *RADIOLOGY REPORT*  Clinical Data: Fall, intoxication.  CT HEAD WITHOUT CONTRAST,CT CERVICAL SPINE WITHOUT CONTRAST  Technique:  Contiguous axial images were obtained from the base of the skull through the vertex without contrast.,Technique: Multidetector CT imaging of the cervical spine was performed. Multiplanar CT image reconstructions were also generated.  Comparison: 02/20/2007  Findings:  Head:  There is no evidence for acute hemorrhage, hydrocephalus, mass lesion, or abnormal extra-axial fluid collection.  No definite CT evidence for acute infarction.  Left maxillary sinus mucous retention cyst versus polyp similar to prior.  Right maxillary sinus mucosal thickening.  Otherwise, the visualized paranasal sinuses and mastoid air cells are predominately clear.  No displaced calvarial fracture. Atherosclerotic vascular calcification.  IMPRESSION: No acute intracranial abnormality.  Cervical spine: Lung apices are clear.  There is mild rightward curvature which may be positional.  No acute fracture or dislocation is identified.  Maintained craniocervical relationship. The dens is intact.  C4-5 disc osteophyte complex results in mild left paracentral canal narrowing.  Impression:  Mild rightward curvature may be positional.  No acute fracture or dislocation identified.   Original Report Authenticated By: Waneta Martins, M.D.    Ct Cervical Spine  Wo Contrast  05/17/2012  *RADIOLOGY REPORT*  Clinical Data: Fall, intoxication.  CT HEAD WITHOUT CONTRAST,CT CERVICAL SPINE WITHOUT CONTRAST  Technique:  Contiguous axial images were obtained from the base of the skull through the vertex without contrast.,Technique: Multidetector CT imaging of the cervical spine was performed. Multiplanar CT image reconstructions were also generated.  Comparison: 02/20/2007  Findings:  Head:  There is no evidence for acute hemorrhage, hydrocephalus, mass lesion, or abnormal extra-axial fluid collection.  No definite CT evidence for acute infarction.  Left maxillary sinus mucous retention cyst versus polyp similar to prior.  Right maxillary sinus mucosal thickening.  Otherwise, the visualized paranasal sinuses and mastoid air cells are predominately clear.  No displaced calvarial fracture. Atherosclerotic vascular calcification.  IMPRESSION: No acute intracranial abnormality.  Cervical spine: Lung apices are clear.  There is mild rightward curvature which may be positional.  No acute fracture or dislocation is identified.  Maintained craniocervical relationship. The dens is intact.  C4-5 disc osteophyte complex  results in mild left paracentral canal narrowing.  Impression:  Mild rightward curvature may be positional.  No acute fracture or dislocation identified.   Original Report Authenticated By: Waneta Martins, M.D.      1. Alcohol intoxication   2. Benzodiazepine abuse   3. Depression       MDM  46 year old female with alcohol intoxication and reported depression. Concern for suicide attempt although she only admits to taking 5 Xanax. We'll allow to sober, and we'll have her evaluated by telepsych        Olivia Mackie, MD 05/17/12 [phone]

## 2012-05-17 NOTE — ED Notes (Signed)
telepsych md reports pt can be discharged when she is medically cleared and starting pt on medication. md will fax paperwork over

## 2012-05-17 NOTE — ED Notes (Signed)
telepsych requested and faxed 

## 2012-05-17 NOTE — ED Notes (Signed)
telepsych being conducted at bedside

## 2012-05-17 NOTE — ED Notes (Signed)
Pt alert and oriented x4. Respirations even and unlabored, bilateral symmetrical rise and fall of chest. Skin warm and dry. In no acute distress. Denies needs.   

## 2012-05-17 NOTE — ED Notes (Signed)
Pt alert x4 v/s 98.2,1110. 94,17, 114/72. Bil wheezing prn nebs , no c/o pain. Is currently not having thoughts of harming her self. Will moniotr.

## 2012-09-05 ENCOUNTER — Other Ambulatory Visit: Payer: Self-pay | Admitting: Obstetrics and Gynecology

## 2012-09-05 ENCOUNTER — Ambulatory Visit (HOSPITAL_BASED_OUTPATIENT_CLINIC_OR_DEPARTMENT_OTHER)
Admission: RE | Admit: 2012-09-05 | Discharge: 2012-09-05 | Disposition: A | Discharge status: Home / Self Care | Payer: Medicaid Other | Source: Ambulatory Visit | Attending: Family Medicine | Admitting: Family Medicine

## 2012-09-05 ENCOUNTER — Other Ambulatory Visit (HOSPITAL_BASED_OUTPATIENT_CLINIC_OR_DEPARTMENT_OTHER): Payer: Self-pay | Admitting: Family Medicine

## 2012-09-05 DIAGNOSIS — N920 Excessive and frequent menstruation with regular cycle: Secondary | ICD-10-CM

## 2012-09-05 DIAGNOSIS — D259 Leiomyoma of uterus, unspecified: Secondary | ICD-10-CM | POA: Insufficient documentation

## 2012-10-09 ENCOUNTER — Encounter: Payer: Self-pay | Admitting: Obstetrics & Gynecology

## 2012-10-09 ENCOUNTER — Ambulatory Visit (INDEPENDENT_AMBULATORY_CARE_PROVIDER_SITE_OTHER): Payer: Medicaid Other | Admitting: Obstetrics & Gynecology

## 2012-10-09 VITALS — BP 148/92 | HR 102 | Temp 98.4°F | Wt 162.0 lb

## 2012-10-09 DIAGNOSIS — D259 Leiomyoma of uterus, unspecified: Secondary | ICD-10-CM | POA: Insufficient documentation

## 2012-10-09 DIAGNOSIS — N920 Excessive and frequent menstruation with regular cycle: Secondary | ICD-10-CM

## 2012-10-09 MED ORDER — MEDROXYPROGESTERONE ACETATE 5 MG PO TABS: 5.0000 mg | ORAL_TABLET | Freq: Every day | ORAL | Status: DC

## 2012-10-09 MED ORDER — DICLOFENAC SODIUM 75 MG PO TBEC: 75.0000 mg | DELAYED_RELEASE_TABLET | Freq: Two times a day (BID) | ORAL | Status: DC

## 2012-10-09 NOTE — Patient Instructions (Signed)
Fibroids Fibroids are lumps (tumors) that can occur any place in a woman's body. These lumps are not cancerous. Fibroids vary in size, weight, and where they grow. HOME CARE  Do not take aspirin.  Write down the number of pads or tampons you use during your period. Tell your doctor. This can help determine the best treatment for you. GET HELP RIGHT AWAY IF:  You have pain in your lower belly (abdomen) that is not helped with medicine.  You have cramps that are not helped with medicine.  You have more bleeding between or during your period.  You feel lightheaded or pass out (faint).  Your lower belly pain gets worse. MAKE SURE YOU:  Understand these instructions.  Will watch your condition.  Will get help right away if you are not doing well or get worse. Document Released: 08/14/2010 Document Revised: 10/04/2011 Document Reviewed: 08/14/2010 [employer] Patient Information 2013 [employer].  

## 2012-10-09 NOTE — Progress Notes (Signed)
Patient ID: Leslie [name], female   DOB: October 10, 1965, 47 y.o.   MRN: [id number]  Chief Complaint  Patient presents with  . Menorrhagia  . Fibroids    HPI Leslie [name] is a 47 y.o. female. Patient's last menstrual period was 09/18/2012.  [id number] Referred due to menses  Lasting more than 2 weeks and low abdominal pain. Now only slight spotting. Previously normal menses only 7 days. US showed fibroid uterus HPI  Past Medical History  Diagnosis Date  . Depression   . Asthma   . Hypertension   Bronchitis  History reviewed. No pertinent past surgical history.  Family History  Problem Relation Age of Onset  . Cancer Mother     lung  . Cancer Father     Social History History  Substance Use Topics  . Smoking status: Current Every Day Smoker -- 1.50 packs/day    Types: Cigarettes  . Smokeless tobacco: Not on file  . Alcohol Use: No  Plans to quit, using electronic cigarettes for 2 days  No Known Allergies  Current Outpatient Prescriptions  Medication Sig Dispense Refill  . ibuprofen (ADVIL,MOTRIN) 200 MG tablet Take 200 mg by mouth every 6 (six) hours as needed. For pain      . albuterol (PROVENTIL HFA;VENTOLIN HFA) 108 (90 BASE) MCG/ACT inhaler Inhale 1-2 puffs into the lungs every 6 (six) hours as needed for wheezing.  1 Inhaler  0  . diclofenac (VOLTAREN) 75 MG EC tablet Take 1 tablet (75 mg total) by mouth 2 (two) times daily with a meal.  60 tablet  2  . medroxyPROGESTERone (PROVERA) 5 MG tablet Take 1 tablet (5 mg total) by mouth daily.  30 tablet  2   No current facility-administered medications for this visit.    Review of Systems Review of Systems  Constitutional: Negative.   Respiratory: Positive for cough. Negative for shortness of breath.   Gastrointestinal: Positive for abdominal pain. Negative for nausea, vomiting and constipation.  Genitourinary: Positive for vaginal bleeding, vaginal discharge, menstrual problem and pelvic pain. Negative for dysuria,  difficulty urinating and dyspareunia.  Psychiatric/Behavioral: The patient is nervous/anxious.     Blood pressure 148/92, pulse 102, temperature 98.4 F (36.9 C), temperature source Oral, weight 162 lb (73.483 kg), last menstrual period 09/18/2012.  Physical Exam Physical Exam  Constitutional: She appears well-developed. No distress.  Pulmonary/Chest: Effort normal. No respiratory distress.  Abdominal: Soft. She exhibits no distension and no mass. There is no tenderness. There is no guarding.  Genitourinary: Vagina normal.  Uterus 8-10 week size, no masses  Skin: Skin is warm and dry.  Psychiatric:  Mildly anxious    Data Reviewed CBC    Component Value Date/Time   WBC 9.1 05/16/2012 2320   RBC 5.02 05/16/2012 2320   HGB 14.2 05/16/2012 2320   HCT 43.3 05/16/2012 2320   PLT 382 05/16/2012 2320   MCV 86.3 05/16/2012 2320   MCH 28.3 05/16/2012 2320   MCHC 32.8 05/16/2012 2320   RDW 17.8* 05/16/2012 2320   LYMPHSABS 4.1* 05/16/2012 2320   MONOABS 0.6 05/16/2012 2320   EOSABS 0.4 05/16/2012 2320   BASOSABS 0.1 05/16/2012 2320    CMP     Component Value Date/Time   NA 138 05/16/2012 2320   K 3.8 05/16/2012 2320   CL 105 05/16/2012 2320   CO2 19 05/16/2012 2320   GLUCOSE 90 05/16/2012 2320   BUN 8 05/16/2012 2320   CREATININE 0.57 05/16/2012 2320   CALCIUM 8.5 05/16/2012 2320  PROT 7.2 05/16/2012 2320   ALBUMIN 3.5 05/16/2012 2320   AST 21 05/16/2012 2320   ALT 34 05/16/2012 2320   ALKPHOS 87 05/16/2012 2320   BILITOT 0.1* 05/16/2012 2320   GFRNONAA >90 05/16/2012 2320   GFRAA >90 05/16/2012 2320   *RADIOLOGY REPORT*  Clinical Data: Vaginal bleeding.  TRANSABDOMINAL AND TRANSVAGINAL ULTRASOUND OF PELVIS  Technique: Both transabdominal and transvaginal ultrasound  examinations of the pelvis were performed. Transabdominal technique  was performed for global imaging of the pelvis including uterus,  ovaries, adnexal regions, and pelvic cul-de-sac.  It was  necessary to proceed with endovaginal exam following the  transabdominal exam to visualize the uterus, endometrium, and  ovaries.  Comparison: Report from 04/04/2001  Findings:  Uterus: Measures 12.4 x 9.4 x 7.6 cm, with a posterior fundal  fibroid measuring 7.2 x 7.6 x 7.6 cm.  Endometrium: Measures 5 mm in thickness, within normal limits.  Right ovary: Unable to visualize either transabdominally or  transvaginally.  Left ovary: Unable to visualize either transabdominally or  transvaginally.  Other findings: No free fluid  IMPRESSION:  1. Unable to visualize the ovaries due to large amounts of bowel  gas.  2. Fundal fibroid measures 7.6 cm in diameter.  Original Report Authenticated By: Gaylyn Rong, M.D.  Pap normal per pt  Assessment    Fibroid uterus, menometrorrhagia and pain     Plan    Provera 5 mg po daily Voltaren 75 mg po bid RTC 4 weeks Consider long-term progestin including oral, injection or Mirena       Leslie [name] 10/09/2012, 4:02 PM

## 2012-10-11 ENCOUNTER — Telehealth: Payer: Self-pay | Admitting: *Deleted

## 2012-10-11 NOTE — Telephone Encounter (Signed)
Pt left message stating that she was seen in [facility] yesterday and was given pain medicine by Dr. Debroah Loop but it didn't help. She wanted to see if he can give her something else.

## 2012-10-14 ENCOUNTER — Emergency Department (HOSPITAL_COMMUNITY)
Admission: EM | Admit: 2012-10-14 | Discharge: 2012-10-14 | Disposition: A | Discharge status: Home / Self Care | Payer: Medicaid Other | Attending: Emergency Medicine | Admitting: Emergency Medicine

## 2012-10-14 ENCOUNTER — Encounter (HOSPITAL_COMMUNITY): Payer: Self-pay | Admitting: Emergency Medicine

## 2012-10-14 DIAGNOSIS — I10 Essential (primary) hypertension: Secondary | ICD-10-CM | POA: Insufficient documentation

## 2012-10-14 DIAGNOSIS — J45901 Unspecified asthma with (acute) exacerbation: Secondary | ICD-10-CM | POA: Insufficient documentation

## 2012-10-14 DIAGNOSIS — F17200 Nicotine dependence, unspecified, uncomplicated: Secondary | ICD-10-CM | POA: Insufficient documentation

## 2012-10-14 DIAGNOSIS — F1010 Alcohol abuse, uncomplicated: Secondary | ICD-10-CM | POA: Insufficient documentation

## 2012-10-14 DIAGNOSIS — Z3202 Encounter for pregnancy test, result negative: Secondary | ICD-10-CM | POA: Insufficient documentation

## 2012-10-14 DIAGNOSIS — Z79899 Other long term (current) drug therapy: Secondary | ICD-10-CM | POA: Insufficient documentation

## 2012-10-14 DIAGNOSIS — T43502A Poisoning by unspecified antipsychotics and neuroleptics, intentional self-harm, initial encounter: Secondary | ICD-10-CM | POA: Insufficient documentation

## 2012-10-14 DIAGNOSIS — F3289 Other specified depressive episodes: Secondary | ICD-10-CM | POA: Insufficient documentation

## 2012-10-14 DIAGNOSIS — T424X4A Poisoning by benzodiazepines, undetermined, initial encounter: Secondary | ICD-10-CM | POA: Insufficient documentation

## 2012-10-14 DIAGNOSIS — R45851 Suicidal ideations: Secondary | ICD-10-CM | POA: Insufficient documentation

## 2012-10-14 DIAGNOSIS — R0602 Shortness of breath: Secondary | ICD-10-CM | POA: Insufficient documentation

## 2012-10-14 DIAGNOSIS — F329 Major depressive disorder, single episode, unspecified: Secondary | ICD-10-CM | POA: Insufficient documentation

## 2012-10-14 DIAGNOSIS — T438X2A Poisoning by other psychotropic drugs, intentional self-harm, initial encounter: Secondary | ICD-10-CM | POA: Insufficient documentation

## 2012-10-14 LAB — URINALYSIS, ROUTINE W REFLEX MICROSCOPIC
Bilirubin Urine: NEGATIVE
Hgb urine dipstick: NEGATIVE
Nitrite: NEGATIVE
Specific Gravity, Urine: 1.01 (ref 1.005–1.030)
pH: 5 (ref 5.0–8.0)

## 2012-10-14 LAB — RAPID URINE DRUG SCREEN, HOSP PERFORMED
Amphetamines: NOT DETECTED
Barbiturates: NOT DETECTED
Benzodiazepines: NOT DETECTED
Cocaine: NOT DETECTED
Tetrahydrocannabinol: NOT DETECTED

## 2012-10-14 LAB — ACETAMINOPHEN LEVEL: Acetaminophen (Tylenol), Serum: <15 ug/mL (ref 10–30)

## 2012-10-14 LAB — PREGNANCY, URINE: Preg Test, Ur: NEGATIVE

## 2012-10-14 LAB — SALICYLATE LEVEL: Salicylate Lvl: <2 mg/dL — ABNORMAL LOW (ref 2.8–20.0)

## 2012-10-14 LAB — COMPREHENSIVE METABOLIC PANEL
ALT: 52 U/L — ABNORMAL HIGH (ref 0–35)
Albumin: 3.3 g/dL — ABNORMAL LOW (ref 3.5–5.2)
Alkaline Phosphatase: 81 U/L (ref 39–117)
Potassium: 3.4 mEq/L — ABNORMAL LOW (ref 3.5–5.1)
Sodium: 145 mEq/L (ref 135–145)
Total Protein: 7.7 g/dL (ref 6.0–8.3)

## 2012-10-14 LAB — CBC
MCHC: 33.4 g/dL (ref 30.0–36.0)
RDW: 17.9 % — ABNORMAL HIGH (ref 11.5–15.5)

## 2012-10-14 LAB — ETHANOL: Alcohol, Ethyl (B): 471 mg/dL (ref 0–11)

## 2012-10-14 MED ORDER — DICLOFENAC SODIUM 75 MG PO TBEC
75.0000 mg | DELAYED_RELEASE_TABLET | Freq: Two times a day (BID) | ORAL | Status: DC
  Administered 2012-10-14: 75 mg via ORAL
  Filled 2012-10-14 (×2): qty 1

## 2012-10-14 MED ORDER — SODIUM CHLORIDE 0.9 % IV BOLUS (SEPSIS)
1000.0000 mL | Freq: Once | INTRAVENOUS | Status: AC
  Administered 2012-10-14: 1000 mL via INTRAVENOUS

## 2012-10-14 MED ORDER — LORAZEPAM 1 MG PO TABS
1.0000 mg | ORAL_TABLET | Freq: Once | ORAL | Status: AC
  Administered 2012-10-14: 1 mg via ORAL
  Filled 2012-10-14: qty 1

## 2012-10-14 MED ORDER — NICOTINE 21 MG/24HR TD PT24
21.0000 mg | MEDICATED_PATCH | Freq: Once | TRANSDERMAL | Status: DC
  Administered 2012-10-14: 21 mg via TRANSDERMAL
  Filled 2012-10-14: qty 1

## 2012-10-14 MED ORDER — ALBUTEROL SULFATE HFA 108 (90 BASE) MCG/ACT IN AERS: 1.0000 | INHALATION_SPRAY | Freq: Four times a day (QID) | RESPIRATORY_TRACT | Status: DC | PRN

## 2012-10-14 MED ORDER — LORAZEPAM 1 MG PO TABS
1.0000 mg | ORAL_TABLET | Freq: Four times a day (QID) | ORAL | Status: DC | PRN
  Administered 2012-10-14: 1 mg via ORAL
  Filled 2012-10-14: qty 1

## 2012-10-14 MED ORDER — ALBUTEROL SULFATE (5 MG/ML) 0.5% IN NEBU
5.0000 mg | INHALATION_SOLUTION | Freq: Once | RESPIRATORY_TRACT | Status: AC
  Administered 2012-10-14: 5 mg via RESPIRATORY_TRACT
  Filled 2012-10-14: qty 1

## 2012-10-14 MED ORDER — MEDROXYPROGESTERONE ACETATE 5 MG PO TABS
5.0000 mg | ORAL_TABLET | Freq: Every day | ORAL | Status: DC
  Filled 2012-10-14: qty 1

## 2012-10-14 NOTE — ED Notes (Addendum)
Per EMS.  Pt called EMs for depression.  Upon arrival pt stated she had drank a fifth of vodka and took a xanax.  Denies SI at present, but states she does not care if she dies.  Pt lethargic.  Pt states she miscarried a month ago for 6th time.  Pt states "I can't breathe".  Speaking full sentences, expiratory wheezing noted

## 2012-10-14 NOTE — ED Notes (Signed)
Patient visitor in bed asleep with patient.

## 2012-10-14 NOTE — ED Notes (Addendum)
Pt got out of bed and attempted to walk down hallway.  Was redirected back to room by 3 staff.  Pt reconnected to heart monitor and [place]. Door left open

## 2012-10-14 NOTE — ED Provider Notes (Signed)
History     CSN: [id number]  Arrival date & time 10/14/12  1252   First MD Initiated Contact with Patient 10/14/12 1257      Chief Complaint  Patient presents with  . Depression  . Alcohol Intoxication    (Consider location/radiation/quality/duration/timing/severity/associated sxs/prior treatment) HPI  The patient presents with suicidal ideation, possible overdose.  Per EMS the patient reported that she drank alcohol, took several Xanax, and doesn't care if she dies.  Per report the patient is listless on EMS arrival.  In route the patient had no notable changes, and on initial exam here the patient is awake, though listless.  She endorses drinking alcohol, taking Xanax, denies other illicit medication ingestion. She states that her daughter, with her granddaughter, left the patient and she is despondent.  She notes a history of depression, but no prior recent attempts at self-harm.  She denies ongoing pain, but does complain of dyspnea.  Past Medical History  Diagnosis Date  . Depression   . Asthma   . Hypertension     History reviewed. No pertinent past surgical history.  Family History  Problem Relation Age of Onset  . Cancer Mother     lung  . Cancer Father     History  Substance Use Topics  . Smoking status: Current Every Day Smoker -- 1.50 packs/day    Types: Cigarettes  . Smokeless tobacco: Not on file  . Alcohol Use: Yes    OB History   Grav Para Term Preterm Abortions TAB SAB Ect Mult Living   6 3 3  0 3  3 0 0 3      Review of Systems  Constitutional: Negative for fever.  HENT: Negative.   Eyes: Negative for visual disturbance.  Respiratory: Positive for shortness of breath and wheezing. Negative for cough and chest tightness.   Cardiovascular: Negative for chest pain.  Gastrointestinal: Negative for nausea.  Musculoskeletal: Negative for myalgias.  Skin: Negative for wound.  Allergic/Immunologic: Negative for immunocompromised state.   Neurological: Negative for weakness.  Psychiatric/Behavioral: Positive for suicidal ideas.    Allergies  Review of patient's allergies indicates no known allergies.  Home Medications   Current Outpatient Rx  Name  Route  Sig  Dispense  Refill  . albuterol (PROVENTIL HFA;VENTOLIN HFA) 108 (90 BASE) MCG/ACT inhaler   Inhalation   Inhale 1-2 puffs into the lungs every 6 (six) hours as needed for wheezing.   1 Inhaler   0   . diclofenac (VOLTAREN) 75 MG EC tablet   Oral   Take 1 tablet (75 mg total) by mouth 2 (two) times daily with a meal.   60 tablet   2   . ibuprofen (ADVIL,MOTRIN) 200 MG tablet   Oral   Take 200 mg by mouth every 6 (six) hours as needed. For pain         . medroxyPROGESTERone (PROVERA) 5 MG tablet   Oral   Take 1 tablet (5 mg total) by mouth daily.   30 tablet   2     BP 133/83  Resp 24  SpO2 90%  LMP 09/18/2012  Physical Exam  Nursing note and vitals reviewed. Constitutional: She is oriented to person, place, and time. She appears well-developed and well-nourished.  Slightly diaphoretic, listless  HENT:  Head: Normocephalic and atraumatic.  Eyes: Conjunctivae and EOM are normal.  Cardiovascular: Normal rate and regular rhythm.   Pulmonary/Chest: No stridor. Tachypnea noted. No respiratory distress. She has wheezes.  Abdominal: She exhibits  no distension.  Musculoskeletal: She exhibits no edema.  Neurological: She is oriented to person, place, and time. No cranial nerve deficit.  Skin: Skin is warm and dry.  Psychiatric: Judgment normal. Her speech is delayed. She is slowed and withdrawn. Cognition and memory are normal. She exhibits a depressed mood.    ED Course  Procedures (including critical care time)  Labs Reviewed - No data to display No results found.   No diagnosis found.   I spoke with EMS after the patient's arrival, given the concern for overdose and SI.    Update: patient remains uncomfortable appearing. I  discussed the patient's case with our behavioral health team.  Given her clinical intoxication, concern for congestion, she will receive IV fluids, and when numerically stable, and awake, and alert more, she will require additional psychiatric evaluation.  3:11 PM Patient w improved BS.   Initial labs notable for ETOH 478    O2- 99%ra, normal   Date: 10/14/2012  Rate: 123  Rhythm: sinus tachycardia  QRS Axis: normal  Intervals: normal  ST/T Wave abnormalities: normal  Conduction Disutrbances:none  Narrative Interpretation:   Old EKG Reviewed: changes noted  FASTER, otherwise unremarkable ECG    Update: Patient tearful, asking to leave.  Sitter to bedside. MDM  This patient presents with depression, endorses suicidal ideation.  Notably, the patient also endorses taking of significant amounts of alcohol and benzodiazepines.  Initially the patient is listless, but awakens to provide some information of the history of present illness.  There is no evidence of trauma, and the patient has no physical complaints.  A review of patient's chart dentures history of depression, hypertension, asthma, but no significant pathology.  Given the patient's endorsed co-ingestion, as well as her suicidal thoughts, there is concern for the patient's welfare.  She remained hemodynamic stable, but is waiting for the psychiatric evaluation once she is more clinically appropriate for this.  CRITICAL CARE Performed by: Gerhard Munch   Total critical care time: 35  Critical care time was exclusive of separately billable procedures and treating other patients.  Critical care was necessary to treat or prevent imminent or life-threatening deterioration.  Critical care was time spent personally by me on the following activities: development of treatment plan with patient and/or surrogate as well as nursing, discussions with consultants, evaluation of patient's response to treatment, examination of patient,  obtaining history from patient or surrogate, ordering and performing treatments and interventions, ordering and review of laboratory studies, ordering and review of radiographic studies, pulse oximetry and re-evaluation of patient's condition.      Gerhard Munch, MD 10/14/12 [phone]

## 2012-10-14 NOTE — ED Notes (Signed)
[id number]<HQ> Expected date:<BR> Expected time:<BR> Means of arrival:<BR> Comments:<BR> 47 y/o F, suicidal, SOB, drank 1/5 vodka

## 2012-10-14 NOTE — ED Notes (Signed)
Pt ambulated to bathroom, but began to get dizzy.  Sat down on floor.  Got in wheelchair to get back to room

## 2012-10-14 NOTE — ED Notes (Signed)
Pt upset she has to stay in ED until ETOH level decreases.  Pt taking off 02, then stating "I can't breathe." 02 sat 92%.  Told to put [place] back on if SOB.

## 2012-10-14 NOTE — ED Notes (Signed)
Patient reports feeling shakey. States she feels like she is having a panic attack. When patient questioned about her alcohol intake, patient states she is a binge drinker, "I go 2-3 weeks without drinking and then I drink for days straight".

## 2012-10-14 NOTE — BH Assessment (Signed)
Assessment Note   Leslie [name] is a 47 y.o. female who presents to [facility] with depression and intoxication.  Pt denies SI/HI/Psych, no plan or intent.  Pt reports the following: pt states ingested 1 Xanax and consumed 3 shots today due to recent stressors: (1) father died Jul 25, 2012, (2) niece died yesterday, and (3) daughter moved out of pt.'s home with granddaughter.  Pt is crying during the assessment and endorses anhedonia, sadness and depression.  Pt has previously attempted to harm in the past when she was a teen, no other attempts since that time.  Pt has outpatient services with [facility] mental health.  Pt admits to SA and has been drinking heavily x 10 days, 12 pk and 1 pint daily.  Per Dr. Estell Harpin, pt will remain the in the ED until sober and then will be d/c'd home.    Axis I: Alcohol Abuse and Major Depression, single episode Axis II: Deferred Axis III:  Past Medical History  Diagnosis Date  . Depression   . Asthma   . Hypertension    Axis IV: other psychosocial or environmental problems, problems related to social environment and problems with primary support group Axis V: 41-50 serious symptoms  Past Medical History:  Past Medical History  Diagnosis Date  . Depression   . Asthma   . Hypertension     History reviewed. No pertinent past surgical history.  Family History:  Family History  Problem Relation Age of Onset  . Cancer Mother     lung  . Cancer Father     Social History:  reports that she has been smoking Cigarettes.  She has been smoking about 1.50 packs per day. She does not have any smokeless tobacco history on file. She reports that  drinks alcohol. She reports that she does not use illicit drugs.  Additional Social History:  Alcohol / Drug Use Pain Medications: See MAR  Prescriptions: See MAR  Over the Counter: See MAR  History of alcohol / drug use?: Yes Longest period of sobriety (when/how long): Unk  Negative Consequences of Use: Financial;Personal  relationships Withdrawal Symptoms: Other (Comment) (No current w/d sxs ) Substance #1 Name of Substance 1: Alcohol--Liquor/Beer  1 - Age of First Use: Teens  1 - Amount (size/oz): 12Pk and 1 Pint  1 - Frequency: Daily  1 - Duration: On-going  1 - Last Use / Amount: 10/14/12  CIWA: CIWA-Ar BP: 109/68 mmHg Pulse Rate: 109 Nausea and Vomiting: no nausea and no vomiting Tactile Disturbances: none Tremor: two Auditory Disturbances: not present Paroxysmal Sweats: no sweat visible Visual Disturbances: not present Anxiety: two Headache, Fullness in Head: none present Agitation: two Orientation and Clouding of Sensorium: oriented and can do serial additions CIWA-Ar Total: 6 COWS:    Allergies: No Known Allergies  Home Medications:  (Not in a hospital admission)  OB/GYN Status:  Patient's last menstrual period was 09/18/2012.  General Assessment Data Location of Assessment: [facility] Living Arrangements: Alone Can pt return to current living arrangement?: Yes Admission Status: Voluntary Is patient capable of signing voluntary admission?: Yes Transfer from: Acute Hospital Referral Source: MD  Education Status Is patient currently in school?: No Current Grade: None  Highest grade of school patient has completed: None  Name of school: None  Contact person: None   Risk to self Suicidal Ideation: No Suicidal Intent: No Is patient at risk for suicide?: No Suicidal Plan?: No Access to Means: Yes Specify Access to Suicidal Means: Medications, Sharps  What has been  your use of drugs/alcohol within the last 12 months?: Abusing: alcohol  Previous Attempts/Gestures: Yes How many times?: 1 (Teens ) Other Self Harm Risks: None  Triggers for Past Attempts: Family contact Intentional Self Injurious Behavior: None Family Suicide History: No Recent stressful life event(s): Loss (Comment) (Father died 12-18-11; Neice died 2012/12/17; Daughter left home) Persecutory voices/beliefs?:  No Depression: Yes Depression Symptoms: Tearfulness;Loss of interest in usual pleasures;Feeling worthless/self pity;Despondent Substance abuse history and/or treatment for substance abuse?: Yes Suicide prevention information given to non-admitted patients: Not applicable  Risk to Others Homicidal Ideation: No Thoughts of Harm to Others: No Current Homicidal Intent: No Current Homicidal Plan: No Access to Homicidal Means: No Identified Victim: None  History of harm to others?: No Assessment of Violence: None Noted Violent Behavior Description: None  Does patient have access to weapons?: No Criminal Charges Pending?: No Does patient have a court date: No  Psychosis Hallucinations: None noted Delusions: None noted  Mental Status Report Appear/Hygiene: Disheveled Eye Contact: Fair Motor Activity: Unremarkable Speech: Logical/coherent;Slurred Level of Consciousness: Alert;Crying Mood: Depressed;Sad;Helpless;Anhedonia Affect: Depressed;Sad Anxiety Level: Moderate Thought Processes: Coherent;Relevant Judgement: Unimpaired Orientation: Person;Place;Time;Situation Obsessive Compulsive Thoughts/Behaviors: None  Cognitive Functioning Concentration: Decreased Memory: Recent Intact;Remote Intact IQ: Average Insight: Fair Impulse Control: Poor Appetite: Good Weight Loss: 0 Weight Gain: 0 Sleep: No Change Total Hours of Sleep: 6 Vegetative Symptoms: None  ADLScreening [facility]) Patient's cognitive ability adequate to safely complete daily activities?: Yes Patient able to express need for assistance with ADLs?: Yes Independently performs ADLs?: Yes (appropriate for developmental age)  Abuse/Neglect [facility]) Physical Abuse: Denies Verbal Abuse: Denies Sexual Abuse: Denies  Prior Inpatient Therapy Prior Inpatient Therapy: No Prior Therapy Dates: None  Prior Therapy Facilty/Provider(s): None  Reason for Treatment: None   Prior Outpatient Therapy Prior  Outpatient Therapy: Yes Prior Therapy Dates: Current  Prior Therapy Facilty/Provider(s): [facility]  Reason for Treatment: Med Mgt   ADL Screening (condition at time of admission) Patient's cognitive ability adequate to safely complete daily activities?: Yes Patient able to express need for assistance with ADLs?: Yes Independently performs ADLs?: Yes (appropriate for developmental age) Weakness of Legs: None Weakness of Arms/Hands: None  Home Assistive Devices/Equipment Home Assistive Devices/Equipment: None  Therapy Consults (therapy consults require a physician order) PT Evaluation Needed: No OT Evalulation Needed: No SLP Evaluation Needed: No Abuse/Neglect Assessment (Assessment to be complete while patient is alone) Physical Abuse: Denies Verbal Abuse: Denies Sexual Abuse: Denies Exploitation of patient/patient's resources: Denies Self-Neglect: Denies Values / Beliefs Cultural Requests During Hospitalization: None Spiritual Requests During Hospitalization: None Consults Spiritual Care Consult Needed: No Social Work Consult Needed: No Merchant navy officer (For Healthcare) Advance Directive: Patient does not have advance directive;Patient would not like information Pre-existing out of facility DNR order (yellow form or pink MOST form): No Nutrition Screen- [facility] Adult/[facility] Patient's home diet: Regular Have you recently lost weight without trying?: No Have you been eating poorly because of a decreased appetite?: No Malnutrition Screening Tool Score: 0  Additional Information 1:1 In Past 12 Months?: No CIRT Risk: No Elopement Risk: No Does patient have medical clearance?: Yes     Disposition:  Disposition Initial Assessment Completed for this Encounter: Yes Disposition of Patient: Other dispositions (Pt will be d/c'd after she sobers up--per Dr. Estell Harpin) Other disposition(s): Information only;To current provider Patient referred to: Other  (Comment);Outpatient clinic referral (Referred back to provider )  On Site Evaluation by:   Reviewed with Physician:     Murrell Redden 10/14/2012  5:25 PM

## 2012-10-14 NOTE — ED Provider Notes (Signed)
Pt no longer suicidal.  She wants to go home.  Pt can ambulate without ataxia  Benny Lennert, MD 10/14/12 2213

## 2012-10-17 NOTE — Telephone Encounter (Signed)
Per chart review, pt was seen @ [facility] ED on 10/14/12 due to mixed ingestion of alcohol and Xanax. She had SI at initial evaluation, then denied later when [facility] evaluation occurred. Pt was d/c'd to home after becoming sober. Pt 's call request from 3/19 for additional pain medication will not be returned at this time.

## 2012-10-30 ENCOUNTER — Ambulatory Visit: Payer: Medicaid Other | Admitting: Obstetrics & Gynecology

## 2012-11-05 ENCOUNTER — Inpatient Hospital Stay (HOSPITAL_COMMUNITY): Payer: Medicaid Other

## 2012-11-05 ENCOUNTER — Encounter (HOSPITAL_COMMUNITY): Payer: Self-pay | Admitting: Family

## 2012-11-05 ENCOUNTER — Inpatient Hospital Stay (HOSPITAL_COMMUNITY)
Admission: AD | Admit: 2012-11-05 | Discharge: 2012-11-05 | Disposition: A | Discharge status: Home / Self Care | Payer: Medicaid Other | Source: Ambulatory Visit | Attending: Obstetrics and Gynecology | Admitting: Obstetrics and Gynecology

## 2012-11-05 DIAGNOSIS — R1012 Left upper quadrant pain: Secondary | ICD-10-CM | POA: Insufficient documentation

## 2012-11-05 DIAGNOSIS — R1011 Right upper quadrant pain: Secondary | ICD-10-CM | POA: Insufficient documentation

## 2012-11-05 DIAGNOSIS — D259 Leiomyoma of uterus, unspecified: Secondary | ICD-10-CM | POA: Insufficient documentation

## 2012-11-05 DIAGNOSIS — R109 Unspecified abdominal pain: Secondary | ICD-10-CM

## 2012-11-05 LAB — CBC WITH DIFFERENTIAL/PLATELET
Basophils Absolute: 0.1 10*3/uL (ref 0.0–0.1)
Basophils Relative: 2 % — ABNORMAL HIGH (ref 0–1)
Eosinophils Absolute: 0.1 10*3/uL (ref 0.0–0.7)
Eosinophils Relative: 2 % (ref 0–5)
HCT: 41.4 % (ref 36.0–46.0)
Hemoglobin: 14 g/dL (ref 12.0–15.0)
Lymphocytes Relative: 35 % (ref 12–46)
Lymphs Abs: 2.6 10*3/uL (ref 0.7–4.0)
MCH: 29.5 pg (ref 26.0–34.0)
MCHC: 33.8 g/dL (ref 30.0–36.0)
MCV: 87.2 fL (ref 78.0–100.0)
Monocytes Absolute: 0.6 10*3/uL (ref 0.1–1.0)
Monocytes Relative: 8 % (ref 3–12)
Neutro Abs: 3.9 10*3/uL (ref 1.7–7.7)
Neutrophils Relative %: 53 % (ref 43–77)
Platelets: 273 10*3/uL (ref 150–400)
RBC: 4.75 MIL/uL (ref 3.87–5.11)
RDW: 17.6 % — ABNORMAL HIGH (ref 11.5–15.5)
WBC: 7.4 10*3/uL (ref 4.0–10.5)

## 2012-11-05 LAB — COMPREHENSIVE METABOLIC PANEL
ALT: 115 U/L — ABNORMAL HIGH (ref 0–35)
AST: 131 U/L — ABNORMAL HIGH (ref 0–37)
Albumin: 3.7 g/dL (ref 3.5–5.2)
Alkaline Phosphatase: 87 U/L (ref 39–117)
BUN: 5 mg/dL — ABNORMAL LOW (ref 6–23)
CO2: 27 mEq/L (ref 19–32)
Calcium: 9.1 mg/dL (ref 8.4–10.5)
Chloride: 99 mEq/L (ref 96–112)
Creatinine, Ser: 0.54 mg/dL (ref 0.50–1.10)
GFR calc Af Amer: >90 mL/min (ref >90)
GFR calc non Af Amer: >90 mL/min (ref >90)
Glucose, Bld: 82 mg/dL (ref 70–99)
Potassium: 4.2 mEq/L (ref 3.5–5.1)
Sodium: 141 mEq/L (ref 135–145)
Total Bilirubin: 0.3 mg/dL (ref 0.3–1.2)
Total Protein: 7.6 g/dL (ref 6.0–8.3)

## 2012-11-05 LAB — LIPASE, BLOOD: Lipase: 86 U/L — ABNORMAL HIGH (ref 11–59)

## 2012-11-05 LAB — AMYLASE: Amylase: 63 U/L (ref 0–105)

## 2012-11-05 MED ORDER — HYDROCODONE-ACETAMINOPHEN 5-300 MG PO TABS: 1.0000 | ORAL_TABLET | ORAL | Status: DC | PRN

## 2012-11-05 MED ORDER — HYDROMORPHONE HCL PF 1 MG/ML IJ SOLN
2.0000 mg | Freq: Once | INTRAMUSCULAR | Status: AC
  Administered 2012-11-05: 2 mg via INTRAMUSCULAR
  Filled 2012-11-05: qty 2

## 2012-11-05 MED ORDER — PANTOPRAZOLE SODIUM 20 MG PO TBEC: 20.0000 mg | DELAYED_RELEASE_TABLET | Freq: Every day | ORAL | Status: DC

## 2012-11-05 NOTE — MAU Provider Note (Signed)
History     CSN: [id number]  Arrival date and time: 11/05/12 1212   None     Chief Complaint  Patient presents with  . Fibroids  . Abdominal Pain   HPI This is a 47 y.o. female who presents with c/o Upper abdominal pain since last night. Thinks this is related to her fibroid (has a known single posterior fundal fibroid about 7cm), though the pain is not located in lower abdomen, where uterus is (fibroid is palpable about 3-4 fb below umbilicus).  Had appt for planning management of fibroid but missed that appointment and did not reschedule.   Takes vicodin chronically for years, and ran out of it. Also has history of Xanax use (buys off street) with heavy alcohol use daily.  States drinks heavily "because I live with my boyfriend who drinks more than me and it makes me want it".  There is a female with her who is a friend of her daughter.  This lady seems to know her well and answers many of her questions.   Denies abnormal pattern of bleeding but does have heavy menses. Denies nausea or fever.  RN Note: Patient presents to MAU with c/o URQ and ULQ abdominal pain since 2000 last night. Reports hx of fibroids.  Denies vaginal bleeding. Reports she missed appointment 2 weeks ago for fibroid f/u. RX for hydrocodone is out and states she had 5 shots of liquor this morning to relieve pain.       OB History   Grav Para Term Preterm Abortions TAB SAB Ect Mult Living   6 3 3  0 3  3 0 0 3      Past Medical History  Diagnosis Date  . Depression   . Asthma   . Hypertension     History reviewed. No pertinent past surgical history.  Family History  Problem Relation Age of Onset  . Cancer Mother     lung  . Cancer Father     History  Substance Use Topics  . Smoking status: Current Every Day Smoker -- 1.50 packs/day    Types: Cigarettes  . Smokeless tobacco: Not on file  . Alcohol Use: Yes     Comment: heavy alcohol consumption x 10 days     Allergies: No Known  Allergies  Prescriptions prior to admission  Medication Sig Dispense Refill  . albuterol (PROVENTIL HFA;VENTOLIN HFA) 108 (90 BASE) MCG/ACT inhaler Inhale 1-2 puffs into the lungs every 6 (six) hours as needed for wheezing.  1 Inhaler  0  . diclofenac (VOLTAREN) 75 MG EC tablet Take 75 mg by mouth 2 (two) times daily.      . medroxyPROGESTERone (PROVERA) 5 MG tablet Take 1 tablet (5 mg total) by mouth daily.  30 tablet  2  . oxyCODONE-acetaminophen (PERCOCET/ROXICET) 5-325 MG per tablet Take 1 tablet by mouth every 4 (four) hours as needed for pain.        Review of Systems  Constitutional: Positive for malaise/fatigue. Negative for fever and chills.  Respiratory: Positive for shortness of breath (States feels like it is hard to breathe sometimes). Negative for cough.   Cardiovascular: Negative for chest pain.  Gastrointestinal: Positive for abdominal pain. Negative for heartburn, nausea, vomiting, diarrhea and constipation.  Genitourinary: Negative for dysuria.  Neurological: Positive for weakness. Negative for dizziness, focal weakness and headaches.  Psychiatric/Behavioral: The patient is nervous/anxious.    Physical Exam   Blood pressure 136/77, pulse 107, temperature 97.5 F (36.4 C), temperature source Oral,  resp. rate 18, last menstrual period 10/24/2012.  Physical Exam  Constitutional: She is oriented to person, place, and time. She appears well-developed. No distress.  Cardiovascular: Normal rate, regular rhythm and normal heart sounds.   Respiratory: Effort normal and breath sounds normal. No respiratory distress. She has no wheezes. She has no rales. She exhibits no tenderness.  Does not appear short of breath   GI: Soft. Bowel sounds are normal. She exhibits no distension and no mass. There is tenderness (over upper abdomen). There is guarding (mild voluntary guarding). There is no rebound.  Genitourinary: Vagina normal and uterus normal. No vaginal discharge found.   Uterus enlarged with palpable 7cm fundal fibroid   Musculoskeletal: Normal range of motion.  Neurological: She is alert and oriented to person, place, and time.  Slurred speech, somnolent at times, alert other times Speech appropriate  Skin: Skin is warm and dry.  Psychiatric: She has a normal mood and affect.    MAU Course  Procedures  MDM Given Dilaudid x one dose for pain.  Sent for ultrasound >> US Abdomen Complete  11/05/2012  *RADIOLOGY REPORT*  Clinical Data:  Upper abdominal pain, elevated LFTs  COMPLETE ABDOMINAL ULTRASOUND  Comparison:  None.  Findings:  Gallbladder:  No gallstones, gallbladder wall thickening, or pericholecystic fluid.  Negative sonographic Murphy's sign.  Common bile duct:  Poorly visualized, measuring 2 mm.  Liver:  Coarse, echogenic hepatic parenchyma, suggesting hepatic steatosis.  No focal hepatic lesion is seen.  IVC:  Poorly visualized.  Pancreas:  Poorly visualized due to overlying bowel gas.  Spleen:  Measures 3.4 cm.  Right Kidney:  Measures 12.3 cm.  No mass or hydronephrosis.  Left Kidney:  Measures 13.2 cm.  No mass or hydronephrosis.  Abdominal aorta:  No aneurysm identified.  IMPRESSION: Suspected hepatic steatosis.  Otherwise negative abdominal ultrasound.   Original Report Authenticated By: Charline Bills, M.D.    Results for orders placed during the hospital encounter of 11/05/12 (from the past 72 hour(s))  CBC WITH DIFFERENTIAL     Status: Abnormal   Collection Time    11/05/12  1:08 PM      Result Value Range   WBC 7.4  4.0 - 10.5 K/uL   RBC 4.75  3.87 - 5.11 MIL/uL   Hemoglobin 14.0  12.0 - 15.0 g/dL   HCT 16.1  09.6 - 04.5 %   MCV 87.2  78.0 - 100.0 fL   MCH 29.5  26.0 - 34.0 pg   MCHC 33.8  30.0 - 36.0 g/dL   RDW 40.9 (*) 81.1 - 91.4 %   Platelets 273  150 - 400 K/uL   Neutrophils Relative 53  43 - 77 %   Neutro Abs 3.9  1.7 - 7.7 K/uL   Lymphocytes Relative 35  12 - 46 %   Lymphs Abs 2.6  0.7 - 4.0 K/uL   Monocytes Relative 8   3 - 12 %   Monocytes Absolute 0.6  0.1 - 1.0 K/uL   Eosinophils Relative 2  0 - 5 %   Eosinophils Absolute 0.1  0.0 - 0.7 K/uL   Basophils Relative 2 (*) 0 - 1 %   Basophils Absolute 0.1  0.0 - 0.1 K/uL  COMPREHENSIVE METABOLIC PANEL     Status: Abnormal   Collection Time    11/05/12  1:08 PM      Result Value Range   Sodium 141  135 - 145 mEq/L   Potassium 4.2  3.5 - 5.1  mEq/L   Chloride 99  96 - 112 mEq/L   CO2 27  19 - 32 mEq/L   Glucose, Bld 82  70 - 99 mg/dL   BUN 5 (*) 6 - 23 mg/dL   Creatinine, Ser 4.09  0.50 - 1.10 mg/dL   Calcium 9.1  8.4 - 81.1 mg/dL   Total Protein 7.6  6.0 - 8.3 g/dL   Albumin 3.7  3.5 - 5.2 g/dL   AST 914 (*) 0 - 37 U/L   ALT 115 (*) 0 - 35 U/L   Alkaline Phosphatase 87  39 - 117 U/L   Total Bilirubin 0.3  0.3 - 1.2 mg/dL   GFR calc non Af Amer >90  >90 mL/min   GFR calc Af Amer >90  >90 mL/min   Comment:            The eGFR has been calculated     using the CKD EPI equation.     This calculation has not been     validated in all clinical     situations.     eGFR's persistently     <90 mL/min signify     possible Chronic Kidney Disease.  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      Probable GERD vs Alcoholic gastritis      Alcoholism and chronic opiod use      History of suicidal thoughts  P:  Discussed need to come back to clinic to make a plan for treating fibroid       Small Rx for Percocet given until she cancall clinic tomorrow       Discussed need to get alcohol rehab arranged (pt wants to get a prescription so she can detox at home herself >> advised that is not a safe way to do it, needs supervision  from providers)        Added Protonix         Medication List    STOP taking these medications       oxyCODONE-acetaminophen 5-325 MG per tablet  Commonly known as:  PERCOCET/ROXICET      TAKE these medications       albuterol 108 (90 BASE) MCG/ACT inhaler  Commonly known as:  PROVENTIL HFA;VENTOLIN HFA  Inhale 1-2 puffs into the lungs every 6 (six) hours as needed for wheezing.     diclofenac 75 MG EC tablet  Commonly known as:  VOLTAREN  Take 75 mg by mouth 2 (two) times daily.     Hydrocodone-Acetaminophen 5-300 MG Tabs  Take 1 tablet by mouth every 4 (four) hours as needed.     medroxyPROGESTERone 5 MG tablet  Commonly known as:  PROVERA  Take 1 tablet (5 mg total) by mouth daily.     pantoprazole 20 MG tablet  Commonly known as:  PROTONIX  Take 1 tablet (20 mg total) by mouth daily.  [facility] 11/05/2012, 1:44 PM

## 2012-11-05 NOTE — MAU Note (Signed)
Patient presents to MAU with c/o URQ and ULQ abdominal pain since 2000 last night. Reports hx of fibroids.  Denies vaginal bleeding. Reports she missed appointment 2 weeks ago for fibroid f/u. RX for hydrocodone is out and states she had 5 shots of liquor this morning to relieve pain.

## 2012-11-06 ENCOUNTER — Encounter (HOSPITAL_BASED_OUTPATIENT_CLINIC_OR_DEPARTMENT_OTHER): Payer: Self-pay

## 2012-11-06 ENCOUNTER — Ambulatory Visit: Payer: Medicaid Other | Admitting: Obstetrics & Gynecology

## 2012-11-06 ENCOUNTER — Emergency Department (HOSPITAL_BASED_OUTPATIENT_CLINIC_OR_DEPARTMENT_OTHER)
Admission: EM | Admit: 2012-11-06 | Discharge: 2012-11-06 | Disposition: A | Discharge status: Home / Self Care | Payer: Medicaid Other | Attending: Emergency Medicine | Admitting: Emergency Medicine

## 2012-11-06 DIAGNOSIS — R000 Tachycardia, unspecified: Secondary | ICD-10-CM | POA: Insufficient documentation

## 2012-11-06 DIAGNOSIS — F411 Generalized anxiety disorder: Secondary | ICD-10-CM | POA: Insufficient documentation

## 2012-11-06 DIAGNOSIS — R109 Unspecified abdominal pain: Secondary | ICD-10-CM | POA: Insufficient documentation

## 2012-11-06 DIAGNOSIS — I10 Essential (primary) hypertension: Secondary | ICD-10-CM | POA: Insufficient documentation

## 2012-11-06 DIAGNOSIS — R112 Nausea with vomiting, unspecified: Secondary | ICD-10-CM | POA: Insufficient documentation

## 2012-11-06 DIAGNOSIS — Z79899 Other long term (current) drug therapy: Secondary | ICD-10-CM | POA: Insufficient documentation

## 2012-11-06 DIAGNOSIS — R079 Chest pain, unspecified: Secondary | ICD-10-CM | POA: Insufficient documentation

## 2012-11-06 DIAGNOSIS — J45909 Unspecified asthma, uncomplicated: Secondary | ICD-10-CM | POA: Insufficient documentation

## 2012-11-06 DIAGNOSIS — F17200 Nicotine dependence, unspecified, uncomplicated: Secondary | ICD-10-CM | POA: Insufficient documentation

## 2012-11-06 DIAGNOSIS — Z8659 Personal history of other mental and behavioral disorders: Secondary | ICD-10-CM | POA: Insufficient documentation

## 2012-11-06 DIAGNOSIS — F10239 Alcohol dependence with withdrawal, unspecified: Secondary | ICD-10-CM

## 2012-11-06 DIAGNOSIS — F10939 Alcohol use, unspecified with withdrawal, unspecified: Secondary | ICD-10-CM | POA: Insufficient documentation

## 2012-11-06 DIAGNOSIS — R259 Unspecified abnormal involuntary movements: Secondary | ICD-10-CM | POA: Insufficient documentation

## 2012-11-06 LAB — COMPREHENSIVE METABOLIC PANEL
ALT: 113 U/L — ABNORMAL HIGH (ref 0–35)
Albumin: 4.1 g/dL (ref 3.5–5.2)
Alkaline Phosphatase: 86 U/L (ref 39–117)
Chloride: 92 mEq/L — ABNORMAL LOW (ref 96–112)
GFR calc Af Amer: >90 mL/min (ref >90)
Glucose, Bld: 96 mg/dL (ref 70–99)
Potassium: 3.7 mEq/L (ref 3.5–5.1)
Sodium: 135 mEq/L (ref 135–145)
Total Bilirubin: 0.6 mg/dL (ref 0.3–1.2)
Total Protein: 8.3 g/dL (ref 6.0–8.3)

## 2012-11-06 LAB — RAPID URINE DRUG SCREEN, HOSP PERFORMED
Amphetamines: NOT DETECTED
Tetrahydrocannabinol: NOT DETECTED

## 2012-11-06 MED ORDER — SODIUM CHLORIDE 0.9 % IV BOLUS (SEPSIS)
1000.0000 mL | Freq: Once | INTRAVENOUS | Status: AC
  Administered 2012-11-06: 1000 mL via INTRAVENOUS

## 2012-11-06 MED ORDER — LORAZEPAM 1 MG PO TABS: 1.0000 mg | ORAL_TABLET | ORAL | Status: DC | PRN

## 2012-11-06 MED ORDER — LORAZEPAM 2 MG/ML IJ SOLN
2.0000 mg | Freq: Once | INTRAMUSCULAR | Status: AC
  Administered 2012-11-06: 2 mg via INTRAVENOUS
  Filled 2012-11-06: qty 1

## 2012-11-06 MED ORDER — ONDANSETRON HCL 4 MG/2ML IJ SOLN
4.0000 mg | Freq: Once | INTRAMUSCULAR | Status: AC
  Administered 2012-11-06: 4 mg via INTRAVENOUS
  Filled 2012-11-06: qty 2

## 2012-11-06 MED ORDER — LORAZEPAM 2 MG/ML IJ SOLN: 1.0000 mg | Freq: Once | INTRAMUSCULAR | Status: DC

## 2012-11-06 MED ORDER — LORAZEPAM 2 MG/ML IJ SOLN
1.0000 mg | Freq: Once | INTRAMUSCULAR | Status: AC
  Administered 2012-11-06: 1 mg via INTRAVENOUS
  Filled 2012-11-06: qty 1

## 2012-11-06 NOTE — ED Notes (Signed)
Patient here with complaint of feeling weakness and shaky. Reports left sided neck pain as well. Awoke this am with vomiting and just not feeling well. [facility] vicodin. Has taken 5 of same and little po intake. Appears anxious on assessment

## 2012-11-06 NOTE — ED Notes (Signed)
MD at bedside. 

## 2012-11-06 NOTE — ED Notes (Signed)
Patient denies pain and is resting  With intermittant tremors CIWA score charted pt admits to frequent ETOH use last drink greater than 24 hrs

## 2012-11-06 NOTE — ED Provider Notes (Signed)
History     CSN: [id number]  Arrival date & time 11/06/12  0551   First MD Initiated Contact with Patient 11/06/12 [phone]      Chief Complaint  Patient presents with  . Weakness    (Consider location/radiation/quality/duration/timing/severity/associated sxs/prior treatment) Patient is a 47 y.o. female presenting with weakness.  Weakness Associated symptoms include chest pain and abdominal pain. Pertinent negatives include no headaches and no shortness of breath.   patient presents thinking she is having a heart attack or stroke. She states that she woke up this morning feeling bad. She states she is shaky and weak all over. She has had some nausea and vomiting. No localizing numbness or weakness. No headache. She states she does have chest pain. She also has abdominal pain. She states she was seen at [facility] hospital yesterday and diagnosed with fibroids. She states she was given Vicodin. She's taken 5 Vicodin. She's not been eating much. She is a heavy drinker but states she is not drinking today. She states she is not withdrawing. No fevers. No diarrhea. She's had upper abdominal pain. She states she thinks she may have an anxiety attack also. She states she's had anxiety in the past and is not on any medicines for now. She states she's been on many things but hasn't been working.  Past Medical History  Diagnosis Date  . Depression   . Asthma   . Hypertension     History reviewed. No pertinent past surgical history.  Family History  Problem Relation Age of Onset  . Cancer Mother     lung  . Cancer Father     History  Substance Use Topics  . Smoking status: Current Every Day Smoker -- 1.50 packs/day    Types: Cigarettes  . Smokeless tobacco: Not on file  . Alcohol Use: Yes     Comment: heavy alcohol consumption x 10 days     OB History   Grav Para Term Preterm Abortions TAB SAB Ect Mult Living   6 3 3  0 3  3 0 0 3      Review of Systems  Constitutional: Negative for  activity change and appetite change.  HENT: Negative for neck stiffness.   Eyes: Negative for pain.  Respiratory: Negative for chest tightness and shortness of breath.   Cardiovascular: Positive for chest pain. Negative for leg swelling.  Gastrointestinal: Positive for nausea, vomiting and abdominal pain. Negative for diarrhea.  Genitourinary: Negative for flank pain.  Musculoskeletal: Negative for back pain.  Skin: Negative for rash.  Neurological: Positive for weakness. Negative for numbness and headaches.  Psychiatric/Behavioral: Negative for behavioral problems. The patient is nervous/anxious.     Allergies  Review of patient's allergies indicates no known allergies.  Home Medications   Current Outpatient Rx  Name  Route  Sig  Dispense  Refill  . albuterol (PROVENTIL HFA;VENTOLIN HFA) 108 (90 BASE) MCG/ACT inhaler   Inhalation   Inhale 1-2 puffs into the lungs every 6 (six) hours as needed for wheezing.   1 Inhaler   0   . diclofenac (VOLTAREN) 75 MG EC tablet   Oral   Take 75 mg by mouth 2 (two) times daily.         . Hydrocodone-Acetaminophen 5-300 MG TABS   Oral   Take 1 tablet by mouth every 4 (four) hours as needed.   10 each   0   . medroxyPROGESTERone (PROVERA) 5 MG tablet   Oral   Take 1 tablet (5  mg total) by mouth daily.   30 tablet   2   . pantoprazole (PROTONIX) 20 MG tablet   Oral   Take 1 tablet (20 mg total) by mouth daily.   30 tablet   1     BP 162/92  Pulse 120  Temp(Src) 98.5 F (36.9 C)  Resp 22  SpO2 96%  LMP 10/24/2012  Physical Exam  Nursing note and vitals reviewed. Constitutional: She is oriented to person, place, and time. She appears well-developed and well-nourished.  HENT:  Head: Normocephalic and atraumatic.  Eyes: EOM are normal. Pupils are equal, round, and reactive to light.  Neck: Normal range of motion. Neck supple.  Cardiovascular: Regular rhythm and normal heart sounds.   No murmur heard. Tachycardia   Pulmonary/Chest: Effort normal and breath sounds normal. No respiratory distress. She has no wheezes. She has no rales.  Abdominal: Soft. Bowel sounds are normal. She exhibits no distension. There is tenderness. There is no rebound and no guarding.  Tenderness to upper abdomen. No rebound or guarding.  Musculoskeletal: Normal range of motion.  Neurological: She is alert and oriented to person, place, and time. No cranial nerve deficit.  Patient is awake and appropriate. She is somewhat tremulous.  Skin: Skin is warm and dry.  Psychiatric: Her speech is normal.  Patient appears anxious.     ED Course  Procedures (including critical care time)  Labs Reviewed  LIPASE, BLOOD  ETHANOL  COMPREHENSIVE METABOLIC PANEL   US Abdomen Complete  11/05/2012  *RADIOLOGY REPORT*  Clinical Data:  Upper abdominal pain, elevated LFTs  COMPLETE ABDOMINAL ULTRASOUND  Comparison:  None.  Findings:  Gallbladder:  No gallstones, gallbladder wall thickening, or pericholecystic fluid.  Negative sonographic Murphy's sign.  Common bile duct:  Poorly visualized, measuring 2 mm.  Liver:  Coarse, echogenic hepatic parenchyma, suggesting hepatic steatosis.  No focal hepatic lesion is seen.  IVC:  Poorly visualized.  Pancreas:  Poorly visualized due to overlying bowel gas.  Spleen:  Measures 3.4 cm.  Right Kidney:  Measures 12.3 cm.  No mass or hydronephrosis.  Left Kidney:  Measures 13.2 cm.  No mass or hydronephrosis.  Abdominal aorta:  No aneurysm identified.  IMPRESSION: Suspected hepatic steatosis.  Otherwise negative abdominal ultrasound.   Original Report Authenticated By: Charline Bills, M.D.      No diagnosis found.   Date: 11/06/2012  Rate: 110  Rhythm: sinus tachycardia  QRS Axis: normal  Intervals: normal  ST/T Wave abnormalities: normal  Conduction Disutrbances:none  Narrative Interpretation:   Old EKG Reviewed: none available    MDM  Patient woke up shaking anxious and weak. Seen yesterday  at [facility] hospital and reportedly diagnosed with platelets, elevated lipase and all of these were mildly elevated. She is a heavy drinker but states she's not been drinking as much recently. Note from [facility] states that she drank 5 beers during the day because she was out of her pain medicine. She is tachycardic here. CMP lipase and alcohol will be done. Urine drug screen is pending. She will be given some Ativan due to anxiety and possible withdrawal. Care will be turned over to Dr. Freida Busman.        Juliet Rude. Rubin Payor, MD 11/06/12 [phone]

## 2012-11-06 NOTE — ED Provider Notes (Signed)
Pt signed out to me by dr. Rubin Payor, pt given Ativan for her tremors likely from alcohol drawn which feels better. No mental status changes noted. No signs of DTs. Will be given prescription for Ativan and was instructed to followup with Dr. Her electrolytes were noted and she was mildly dehydrated and this was treated with 2 L of IV saline.  Toy Baker, MD 11/06/12 [phone]

## 2012-11-06 NOTE — ED Notes (Signed)
Room air O2 sat decreased to 88 to 90 % O2 at 2l n/c started saturation improved rapidly to 95%

## 2012-11-07 LAB — URINE DRUGS OF ABUSE SCREEN W ALC, ROUTINE (REF LAB)
Amphetamine Screen, Ur: NEGATIVE
Cocaine Metabolites: NEGATIVE
Creatinine,U: 22.6 mg/dL
Ethyl Alcohol: 350 mg/dL — ABNORMAL HIGH (ref <10)
Phencyclidine (PCP): NEGATIVE

## 2012-11-07 NOTE — MAU Provider Note (Signed)
Attestation of Attending Supervision of Advanced Practitioner: Evaluation and management procedures were performed by the PA/NP/CNM/OB Fellow under my supervision/collaboration. Chart reviewed and agree with management and plan.  Case was discussed during evaluation and management plan decisionmaking  Tilda Burrow 11/07/2012 12:08 PM

## 2012-11-30 ENCOUNTER — Ambulatory Visit (INDEPENDENT_AMBULATORY_CARE_PROVIDER_SITE_OTHER): Payer: Medicaid Other | Admitting: Obstetrics & Gynecology

## 2012-11-30 ENCOUNTER — Encounter: Payer: Self-pay | Admitting: Obstetrics & Gynecology

## 2012-11-30 VITALS — BP 133/86 | HR 76 | Temp 97.8°F | Ht 65.0 in | Wt 157.3 lb

## 2012-11-30 DIAGNOSIS — N920 Excessive and frequent menstruation with regular cycle: Secondary | ICD-10-CM

## 2012-11-30 DIAGNOSIS — D259 Leiomyoma of uterus, unspecified: Secondary | ICD-10-CM

## 2012-11-30 MED ORDER — MEDROXYPROGESTERONE ACETATE 5 MG PO TABS: 5.0000 mg | ORAL_TABLET | Freq: Every day | ORAL | Status: DC

## 2012-11-30 NOTE — Patient Instructions (Signed)
Levonorgestrel intrauterine device (IUD) What is this medicine? LEVONORGESTREL IUD (LEE voe nor jes trel) is a contraceptive (birth control) device. The device is placed inside the uterus by a healthcare professional. It is used to prevent pregnancy and can also be used to treat heavy bleeding that occurs during your period. Depending on the device, it can be used for 3 to 5 years. This medicine may be used for other purposes; ask your health care provider or pharmacist if you have questions. What should I tell my health care provider before I take this medicine? They need to know if you have any of these conditions: -abnormal Pap smear -cancer of the breast, uterus, or cervix -diabetes -endometritis -genital or pelvic infection now or in the past -have more than one sexual partner or your partner has more than one partner -heart disease -history of an ectopic or tubal pregnancy -immune system problems -IUD in place -liver disease or tumor -problems with blood clots or take blood-thinners -use intravenous drugs -uterus of unusual shape -vaginal bleeding that has not been explained -an unusual or allergic reaction to levonorgestrel, other hormones, silicone, or polyethylene, medicines, foods, dyes, or preservatives -pregnant or trying to get pregnant -breast-feeding How should I use this medicine? This device is placed inside the uterus by a health care professional. Talk to your pediatrician regarding the use of this medicine in children. Special care may be needed. Overdosage: If you think you have taken too much of this medicine contact a poison control center or emergency room at once. NOTE: This medicine is only for you. Do not share this medicine with others. What if I miss a dose? This does not apply. What may interact with this medicine? Do not take this medicine with any of the following medications: -amprenavir -bosentan -fosamprenavir This medicine may also interact with  the following medications: -aprepitant -barbiturate medicines for inducing sleep or treating seizures -bexarotene -griseofulvin -medicines to treat seizures like carbamazepine, ethotoin, felbamate, oxcarbazepine, phenytoin, topiramate -modafinil -pioglitazone -rifabutin -rifampin -rifapentine -some medicines to treat HIV infection like atazanavir, indinavir, lopinavir, nelfinavir, tipranavir, ritonavir -St. John's wort -warfarin This list may not describe all possible interactions. Give your health care provider a list of all the medicines, herbs, non-prescription drugs, or dietary supplements you use. Also tell them if you smoke, drink alcohol, or use illegal drugs. Some items may interact with your medicine. What should I watch for while using this medicine? Visit your doctor or health care professional for regular check ups. See your doctor if you or your partner has sexual contact with others, becomes HIV positive, or gets a sexual transmitted disease. This product does not protect you against HIV infection (AIDS) or other sexually transmitted diseases. You can check the placement of the IUD yourself by reaching up to the top of your vagina with clean fingers to feel the threads. Do not pull on the threads. It is a good habit to check placement after each menstrual period. Call your doctor right away if you feel more of the IUD than just the threads or if you cannot feel the threads at all. The IUD may come out by itself. You may become pregnant if the device comes out. If you notice that the IUD has come out use a backup birth control method like condoms and call your health care provider. Using tampons will not change the position of the IUD and are okay to use during your period. What side effects may I notice from receiving this medicine?   Side effects that you should report to your doctor or health care professional as soon as possible: -allergic reactions like skin rash, itching or  hives, swelling of the face, lips, or tongue -fever, flu-like symptoms -genital sores -high blood pressure -no menstrual period for 6 weeks during use -pain, swelling, warmth in the leg -pelvic pain or tenderness -severe or sudden headache -signs of pregnancy -stomach cramping -sudden shortness of breath -trouble with balance, talking, or walking -unusual vaginal bleeding, discharge -yellowing of the eyes or skin Side effects that usually do not require medical attention (report to your doctor or health care professional if they continue or are bothersome): -acne -breast pain -change in sex drive or performance -changes in weight -cramping, dizziness, or faintness while the device is being inserted -headache -irregular menstrual bleeding within first 3 to 6 months of use -nausea This list may not describe all possible side effects. Call your doctor for medical advice about side effects. You may report side effects to FDA at 1-800-FDA-1088. Where should I keep my medicine? This does not apply. NOTE: This sheet is a summary. It may not cover all possible information. If you have questions about this medicine, talk to your doctor, pharmacist, or health care provider.  2013, [employer] Standard. (08/12/2011 1:54:04 PM)  

## 2012-11-30 NOTE — Progress Notes (Signed)
  Subjective:    Patient ID: Leslie [name], female    DOB: [birth year], 47 y.o.   MRN: [id number]  HPI Patient's last menstrual period was 10/24/2012. Period is late but exam shows dark vaginal bleeding today.  Lower abdominal pain. Does not want hysterectomy if possible.  Current Outpatient Prescriptions on File Prior to Visit  Medication Sig Dispense Refill  . albuterol (PROVENTIL HFA;VENTOLIN HFA) 108 (90 BASE) MCG/ACT inhaler Inhale 1-2 puffs into the lungs every 6 (six) hours as needed for wheezing.  1 Inhaler  0  . diclofenac (VOLTAREN) 75 MG EC tablet Take 75 mg by mouth 2 (two) times daily.      . Hydrocodone-Acetaminophen 5-300 MG TABS Take 1 tablet by mouth every 4 (four) hours as needed.  10 each  0  . LORazepam (ATIVAN) 1 MG tablet Take 1 tablet (1 mg total) by mouth every 4 (four) hours as needed (tremors or shakes).  15 tablet  0  . pantoprazole (PROTONIX) 20 MG tablet Take 1 tablet (20 mg total) by mouth daily.  30 tablet  1   No current facility-administered medications on file prior to visit.   No Known Allergies History   Social History  . Marital Status: Legally Separated    Spouse Name: N/A    Number of Children: N/A  . Years of Education: N/A   Occupational History  . Not on file.   Social History Main Topics  . Smoking status: Current Every Day Smoker -- 0.50 packs/day    Types: Cigarettes  . Smokeless tobacco: Never Used  . Alcohol Use: Yes     Comment: heavy alcohol consumption x 10 days   . Drug Use: No  . Sexually Active: No   Other Topics Concern  . Not on file   Social History Narrative  . No narrative on file       Review of Systems  Genitourinary: Positive for vaginal bleeding (dark blood noted today) and pelvic pain. Negative for vaginal discharge and vaginal pain.  Psychiatric/Behavioral: The patient is nervous/anxious (being followed by [facility]        Objective:   Physical Exam  Constitutional: She is oriented to person, place,  and time. She appears well-developed. No distress.  Pulmonary/Chest: Effort normal. No respiratory distress.  Abdominal: Soft. She exhibits no mass.  Genitourinary: Vagina normal.  Uterus 10 week size, no mass in adnexa  Neurological: She is alert and oriented to person, place, and time.  Skin: Skin is warm and dry. No pallor.  Psychiatric: She has a normal mood and affect. Her behavior is normal.   CBC    Component Value Date/Time   WBC 7.4 11/05/2012 1308   RBC 4.75 11/05/2012 1308   HGB 14.0 11/05/2012 1308   HCT 41.4 11/05/2012 1308   PLT 273 11/05/2012 1308   MCV 87.2 11/05/2012 1308   MCH 29.5 11/05/2012 1308   MCHC 33.8 11/05/2012 1308   RDW 17.6* 11/05/2012 1308   LYMPHSABS 2.6 11/05/2012 1308   MONOABS 0.6 11/05/2012 1308   EOSABS 0.1 11/05/2012 1308   BASOSABS 0.1 11/05/2012 1308           Assessment & Plan:  Fibroid uterus Bleeding improved on Provera.  Consider DMPA, Mirena RTC 1 mo  Adam Phenix, MD 11/30/2012 3:46 PM

## 2012-12-01 ENCOUNTER — Ambulatory Visit (HOSPITAL_BASED_OUTPATIENT_CLINIC_OR_DEPARTMENT_OTHER): Payer: Medicaid Other | Attending: Family Medicine

## 2012-12-01 VITALS — Ht 65.0 in | Wt 157.0 lb

## 2012-12-01 DIAGNOSIS — G4700 Insomnia, unspecified: Secondary | ICD-10-CM | POA: Insufficient documentation

## 2012-12-01 DIAGNOSIS — R059 Cough, unspecified: Secondary | ICD-10-CM | POA: Insufficient documentation

## 2012-12-01 DIAGNOSIS — G4733 Obstructive sleep apnea (adult) (pediatric): Secondary | ICD-10-CM

## 2012-12-01 DIAGNOSIS — G4730 Sleep apnea, unspecified: Secondary | ICD-10-CM | POA: Insufficient documentation

## 2012-12-01 DIAGNOSIS — G40909 Epilepsy, unspecified, not intractable, without status epilepticus: Secondary | ICD-10-CM | POA: Insufficient documentation

## 2012-12-01 DIAGNOSIS — R0602 Shortness of breath: Secondary | ICD-10-CM | POA: Insufficient documentation

## 2012-12-01 DIAGNOSIS — R05 Cough: Secondary | ICD-10-CM | POA: Insufficient documentation

## 2012-12-01 DIAGNOSIS — R5381 Other malaise: Secondary | ICD-10-CM | POA: Insufficient documentation

## 2012-12-01 DIAGNOSIS — G4710 Hypersomnia, unspecified: Secondary | ICD-10-CM | POA: Insufficient documentation

## 2012-12-02 DIAGNOSIS — G4730 Sleep apnea, unspecified: Secondary | ICD-10-CM

## 2012-12-02 NOTE — Procedures (Signed)
NAME:  Leslie [name], Leslie [name]               ACCOUNT NO.:  [id number]  MEDICAL RECORD NO.:  [id number]          PATIENT TYPE:  OUT  LOCATION:  SLEEP CENTER                 FACILITY:  [facility]  PHYSICIAN:  Clinton D. Maple Hudson, MD, FCCP, FACPDATE OF BIRTH:  01/[birth year]  DATE OF STUDY:  12/01/2012                           NOCTURNAL POLYSOMNOGRAM  REFERRING PHYSICIAN:  Windle Guard, M.D.  INDICATION FOR STUDY:  Hypersomnia with sleep apnea.  EPWORTH SLEEPINESS SCORE:  1/24.  BMI 26, weight 157 pounds, height 55 inches.  Neck 12.5 inches.  MEDICATIONS:  Home medications charted and reviewed.  SLEEP ARCHITECTURE:  Total sleep time 264.5 minutes with sleep efficiency 63%.  Stage I was 1.5%, stage II was 73.2%, stage III was absent.  REM 25.3% of total sleep time.  Sleep latency 38.5 minutes, REM latency 112.5 minutes, awake after sleep onset 117.5 minutes.  Arousal index 5.4.  Bedtime medication:  None.  RESPIRATORY DATA:  Apnea-hypopnea index (AHI) 0.2 per hour.  A single event was recorded in hypopnea while non-supine.  She did not qualify for split protocol CPAP titration.  OXYGEN DATA:  Mild snoring with oxygen desaturation to a nadir of 91% and mean oxygen saturation through the study of 95.6% on room air.  CARDIAC DATA:  Sinus rhythm with occasional PAC and PVC.  MOVEMENT/PARASOMNIA:  A total of 68 limb jerks were counted, of which 5 were associated with arousals or awakening for periodic limb movement with arousal index of 1.1 per hour.  Bathroom x2.  IMPRESSION/RECOMMENDATION: 1. Difficulty initiating and maintaining sleep.  Sustained sleep was     not noted until nearly midnight and she was awake spontaneously by     3:30 a.m. for most of the rest of the study.  It may be appropriately     managed as insomnia. 2. A single respiratory event with sleep disturbance, within normal     limits.  AHI 0.2 per hour (the normal range for adults is from 0-5     events per hour).  Mild snoring  with oxygen desaturation to a nadir     of 91% and mean oxygen saturation through the study of 95.6% on     room air. 3. 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  A total of 68     limb jerks were counted, of which 5 were associated with arousals     or awakening for periodic limb movement for arousal index of 1.1     per hour.  This may be insignificant, but if clinical history     determined is that limb movement does cause substantial sleep     disturbance in the home environment, then a therapeutic trial of     specific therapy such as ReQuip or Mirapex might be considered if     appropriate.     Clinton D. Maple Hudson, MD, [facility], FACP Diplomate, American Board of Sleep Medicine    CDY/MEDQ  D:  12/02/2012 14:30:59  T:  12/02/2012 19:54:45  Job:  [id number]

## 2012-12-08 ENCOUNTER — Telehealth: Payer: Self-pay | Admitting: *Deleted

## 2012-12-08 NOTE — Telephone Encounter (Signed)
Pt left message stating that she is having a lot of pain from her fibroid. She is requesting pain med. I returned pt's call and advised her to try ibuprofen 600mg  every 6hrs w/food for the next 24 hrs. Then she may take 600mg  every 6hrs as needed w/food to control the pain. She may call back if she has further questions or problems. Pt voiced understanding.

## 2012-12-25 ENCOUNTER — Telehealth: Payer: Self-pay

## 2012-12-25 NOTE — Telephone Encounter (Signed)
Pt called and stated that she is having severe pain from her thyroid.  "I need something prescribed."

## 2012-12-26 NOTE — Telephone Encounter (Signed)
Patient called back and stated that she is in severe pain and cannot wait until her scheduled appt. I advised that she go to [facility] to be seen since her pain is severe. Pain is not from her thyroid but a fibroid. Pt agrees to go to [facility].

## 2012-12-26 NOTE — Telephone Encounter (Signed)
Reviewed patient's chart and see she was supposed to have a one month follow up around beginning of June but no appt was made. Made appt for follow up with Debroah Loop for 6/23 @ 12:45- patient needs to be informed of this appt. Called patient, no answer- left message that we are returning her phone call and to please call us back.

## 2013-01-15 ENCOUNTER — Ambulatory Visit: Payer: Medicaid Other | Admitting: Obstetrics & Gynecology

## 2013-01-26 ENCOUNTER — Encounter (HOSPITAL_COMMUNITY): Payer: Self-pay

## 2013-01-26 ENCOUNTER — Emergency Department (HOSPITAL_COMMUNITY)
Admission: EM | Admit: 2013-01-26 | Discharge: 2013-01-26 | Disposition: A | Discharge status: Home / Self Care | Payer: Medicaid Other | Attending: Emergency Medicine | Admitting: Emergency Medicine

## 2013-01-26 DIAGNOSIS — Z3202 Encounter for pregnancy test, result negative: Secondary | ICD-10-CM | POA: Insufficient documentation

## 2013-01-26 DIAGNOSIS — Z79899 Other long term (current) drug therapy: Secondary | ICD-10-CM | POA: Insufficient documentation

## 2013-01-26 DIAGNOSIS — F1010 Alcohol abuse, uncomplicated: Secondary | ICD-10-CM | POA: Insufficient documentation

## 2013-01-26 DIAGNOSIS — R110 Nausea: Secondary | ICD-10-CM | POA: Insufficient documentation

## 2013-01-26 DIAGNOSIS — R109 Unspecified abdominal pain: Secondary | ICD-10-CM | POA: Insufficient documentation

## 2013-01-26 DIAGNOSIS — F17200 Nicotine dependence, unspecified, uncomplicated: Secondary | ICD-10-CM | POA: Insufficient documentation

## 2013-01-26 DIAGNOSIS — Z8659 Personal history of other mental and behavioral disorders: Secondary | ICD-10-CM | POA: Insufficient documentation

## 2013-01-26 DIAGNOSIS — J45909 Unspecified asthma, uncomplicated: Secondary | ICD-10-CM | POA: Insufficient documentation

## 2013-01-26 DIAGNOSIS — Z8742 Personal history of other diseases of the female genital tract: Secondary | ICD-10-CM | POA: Insufficient documentation

## 2013-01-26 DIAGNOSIS — I10 Essential (primary) hypertension: Secondary | ICD-10-CM | POA: Insufficient documentation

## 2013-01-26 LAB — URINALYSIS, ROUTINE W REFLEX MICROSCOPIC
Bilirubin Urine: NEGATIVE
Hgb urine dipstick: NEGATIVE
Nitrite: NEGATIVE
Specific Gravity, Urine: 1.013 (ref 1.005–1.030)
Urobilinogen, UA: 0.2 mg/dL (ref 0.0–1.0)
pH: 5 (ref 5.0–8.0)

## 2013-01-26 LAB — CBC WITH DIFFERENTIAL/PLATELET
Basophils Absolute: 0.1 10*3/uL (ref 0.0–0.1)
Basophils Relative: 1 % (ref 0–1)
Eosinophils Absolute: 0.2 10*3/uL (ref 0.0–0.7)
Eosinophils Relative: 2 % (ref 0–5)
HCT: 41.8 % (ref 36.0–46.0)
MCH: 29.7 pg (ref 26.0–34.0)
MCHC: 33.5 g/dL (ref 30.0–36.0)
MCV: 88.7 fL (ref 78.0–100.0)
Monocytes Absolute: 0.6 10*3/uL (ref 0.1–1.0)
Platelets: 226 10*3/uL (ref 150–400)
RDW: 21 % — ABNORMAL HIGH (ref 11.5–15.5)
WBC: 8.2 10*3/uL (ref 4.0–10.5)

## 2013-01-26 LAB — COMPREHENSIVE METABOLIC PANEL
ALT: 36 U/L — ABNORMAL HIGH (ref 0–35)
AST: 33 U/L (ref 0–37)
CO2: 23 mEq/L (ref 19–32)
Calcium: 8.6 mg/dL (ref 8.4–10.5)
Creatinine, Ser: 0.54 mg/dL (ref 0.50–1.10)
GFR calc non Af Amer: >90 mL/min (ref >90)
Sodium: 140 mEq/L (ref 135–145)
Total Protein: 7.3 g/dL (ref 6.0–8.3)

## 2013-01-26 LAB — WET PREP, GENITAL
Trich, Wet Prep: NONE SEEN
Yeast Wet Prep HPF POC: NONE SEEN

## 2013-01-26 LAB — POCT PREGNANCY, URINE: Preg Test, Ur: NEGATIVE

## 2013-01-26 LAB — LIPASE, BLOOD: Lipase: 58 U/L (ref 11–59)

## 2013-01-26 MED ORDER — MORPHINE SULFATE 4 MG/ML IJ SOLN
4.0000 mg | Freq: Once | INTRAMUSCULAR | Status: AC
  Administered 2013-01-26: 2 mg via INTRAVENOUS
  Filled 2013-01-26: qty 1

## 2013-01-26 MED ORDER — ONDANSETRON HCL 4 MG PO TABS: 4.0000 mg | ORAL_TABLET | Freq: Four times a day (QID) | ORAL | Status: DC

## 2013-01-26 MED ORDER — SODIUM CHLORIDE 0.9 % IV BOLUS (SEPSIS)
1000.0000 mL | Freq: Once | INTRAVENOUS | Status: AC
  Administered 2013-01-26: 1000 mL via INTRAVENOUS

## 2013-01-26 MED ORDER — ACETAMINOPHEN 500 MG PO TABS: 500.0000 mg | ORAL_TABLET | Freq: Four times a day (QID) | ORAL | Status: DC | PRN

## 2013-01-26 MED ORDER — FOLIC ACID 1 MG PO TABS
1.0000 mg | ORAL_TABLET | Freq: Once | ORAL | Status: AC
  Administered 2013-01-26: 1 mg via ORAL
  Filled 2013-01-26: qty 1

## 2013-01-26 MED ORDER — VITAMIN B-1 100 MG PO TABS
100.0000 mg | ORAL_TABLET | Freq: Once | ORAL | Status: AC
  Administered 2013-01-26: 100 mg via ORAL
  Filled 2013-01-26: qty 1

## 2013-01-26 MED ORDER — ONDANSETRON HCL 4 MG/2ML IJ SOLN
4.0000 mg | Freq: Once | INTRAMUSCULAR | Status: AC
  Administered 2013-01-26: 4 mg via INTRAVENOUS
  Filled 2013-01-26: qty 2

## 2013-01-26 NOTE — ED Notes (Addendum)
PA at bedside; assisted MD with pelvic exam.  While administering Morphine IV, pt began having a panic attack- gave only 2mg  (4mg  was ordered) Morphine per PA at bedside due to pt expressing that she is afraid of taking new medicines.  PA and RN encouraged pt to breathe and PA explained that Morphine does not initiate panic attacks. Able to calm patient down in a few minutes.

## 2013-01-26 NOTE — ED Notes (Signed)
Pt ambulated in hall with steady gait and no complaints of dizziness.

## 2013-01-26 NOTE — ED Notes (Addendum)
  Approximately 1645, Kerin Ransom, RN saw pt standing at the sink and pt's knees gave way and pt fell to the floor; pt did not hit her head.  RN, NT and Consulting civil engineer to bedside immediately;  assessed pt's head and body for any injury.  Forestine Chute, PA called to bedside.  Pt's IV connector dislodged.  IV removed and new IV will be placed. Pt had on red socks.  Safety huddle performed immediately.      Per PA, ambulate after IV fluids infuse.

## 2013-01-26 NOTE — ED Provider Notes (Signed)
History    CSN: [id number] Arrival date & time 01/26/13  1358  First MD Initiated Contact with Patient 01/26/13 1443     Chief Complaint  Patient presents with  . Abdominal Pain  . Nausea   (Consider location/radiation/quality/duration/timing/severity/associated sxs/prior Treatment) HPI  47 year old female with history of uterine fibroid presents complaining of abd pain.  Patient reports she has been having intermittent abdominal pain ongoing for the past 3 months. Describe pain as a crampy labor types of pain lasting for several hours, worsening with walking and with movement and improved by drinking alcohol. For the past 3 days pain has been associated with nausea, without vomiting however she has endorse diarrhea. Patient has >10 bouts of nonbloody non-mucous diarrhea, and having bowel movement worsening abdominal pain. No recent antibiotic use. Denies fever, chills, chest pain, shortness of breath, back pain, dysuria, hematuria, hematochezia, or melena. Patient reports she had an appointment at [facility] hospital for a fibroid recheck 2 weeks ago but missed her appointment due to family situation. Pt is a G6P3.  LMP June 6th.  Past Medical History  Diagnosis Date  . Depression   . Asthma   . Hypertension   . Fibroids    History reviewed. No pertinent past surgical history. Family History  Problem Relation Age of Onset  . Cancer Mother     lung  . Cancer Father    History  Substance Use Topics  . Smoking status: Current Every Day Smoker -- 0.50 packs/day    Types: Cigarettes  . Smokeless tobacco: Never Used  . Alcohol Use: Yes     Comment: 3 shots alcohol today.    OB History   Grav Para Term Preterm Abortions TAB SAB Ect Mult Living   6 3 3  0 3  3 0 0 3     Review of Systems  All other systems reviewed and are negative.    Allergies  Review of patient's allergies indicates no known allergies.  Home Medications   Current Outpatient Rx  Name  Route  Sig   Dispense  Refill  . albuterol (PROVENTIL HFA;VENTOLIN HFA) 108 (90 BASE) MCG/ACT inhaler   Inhalation   Inhale 1-2 puffs into the lungs every 6 (six) hours as needed for wheezing.   1 Inhaler   0   . pantoprazole (PROTONIX) 20 MG tablet   Oral   Take 1 tablet (20 mg total) by mouth daily.   30 tablet   1    BP 119/71  Pulse 104  Temp(Src) 97.8 F (36.6 C) (Oral)  Resp 16  SpO2 9%  LMP 12/29/2012 Physical Exam  Nursing note and vitals reviewed. Constitutional: She is oriented to person, place, and time. She appears well-developed and well-nourished. No distress.  Patient is moderately obese, appears uncomfortable nontoxic in appearance.  HENT:  Head: Normocephalic and atraumatic.  Mouth/Throat: Oropharynx is clear and moist.  Hirsutism  Eyes: Conjunctivae are normal.  Neck: Normal range of motion. Neck supple.  Cardiovascular: Normal rate and regular rhythm.   Mildly tachycardic without murmurs, rubs, gallops  Pulmonary/Chest: Effort normal and breath sounds normal. She exhibits no tenderness.  Mild scattered expiratory wheezes without rhonchi or rales  Abdominal: Soft. There is tenderness (Abdomen is soft but diffusely tender without focal point tenderness. No guarding or rebound tenderness. no hernia noted.).  Genitourinary: Vagina normal and uterus normal. There is no rash or lesion on the right labia. There is no rash or lesion on the left labia. Cervix exhibits no  motion tenderness and no discharge. Right adnexum displays no mass and no tenderness. Left adnexum displays no mass and no tenderness. No erythema, tenderness or bleeding around the vagina. No vaginal discharge found.  Chaperone present  Lymphadenopathy:       Right: No inguinal adenopathy present.       Left: No inguinal adenopathy present.  Neurological: She is alert and oriented to person, place, and time.  Skin: No rash noted.  Psychiatric: She has a normal mood and affect.    ED Course  Procedures  (including critical care time)  3:07 PM Patient with vague chronic abdominal pain. Abdomen is difficult to exam due to patient's uncooperation. She is afebrile, mildly tachycardic but otherwise vital signs unremarkable. Workup initiated.  4:23 PM Labs are reassuring with exception of ETOH of 294.  Pelvic exam unremarkable.  No evidence of PID.  Gc/ch cultures sent.    4:50 PM Pt report abd pain improved.  She however fell off the bed while trying to get up to change her gown because it was leaking with IV fluid.  No c/o pain, no LOC.  IN the setting of alcohol intoxication, will continue to monitor, hydration and will ambulate pt in 1 hr.  Nurse is aware.  6:02 PM Pt felt better. Able to ambulate, and is clinically sober.  Family member will be available to bring pt home.  Pt to f/u with PCP for further care.  Alcohol cessation discussed.    Labs Reviewed  WET PREP, GENITAL - Abnormal; Notable for the following:    Clue Cells Wet Prep HPF POC FEW (*)    WBC, Wet Prep HPF POC FEW (*)    All other components within normal limits  CBC WITH DIFFERENTIAL - Abnormal; Notable for the following:    RDW 21.0 (*)    All other components within normal limits  COMPREHENSIVE METABOLIC PANEL - Abnormal; Notable for the following:    Albumin 3.4 (*)    ALT 36 (*)    All other components within normal limits  URINALYSIS, ROUTINE W REFLEX MICROSCOPIC - Abnormal; Notable for the following:    APPearance CLOUDY (*)    All other components within normal limits  ETHANOL - Abnormal; Notable for the following:    Alcohol, Ethyl (B) 294 (*)    All other components within normal limits  GC/CHLAMYDIA PROBE AMP  LIPASE, BLOOD  POCT PREGNANCY, URINE   No results found. 1. Abdominal pain   2. Alcohol abuse     MDM  BP 135/86  Pulse 91  Temp(Src) 97.8 F (36.6 C) (Oral)  Resp 16  SpO2 100%  LMP 12/29/2012  I have reviewed nursing notes and vital signs. I personally reviewed the imaging tests  through PACS system  I reviewed available ER/hospitalization records thought the EMR   Fayrene Helper, [place] 01/26/13 2003

## 2013-01-26 NOTE — ED Notes (Addendum)
Pt here via ems for c/o abd pain x83months  Hx fibroid.pt stated she took etoh shots to relieved pain some nausea denies diaherra

## 2013-01-26 NOTE — ED Notes (Addendum)
Patient reports that she is having lower abdominal pain and had an appointment [facility] for fibroid recheck, but did not keep he appointment. Patient states she has not taken any medications for nausea or abdominal pain. Patient c/o constant nausea, ut denies vomiting or diarrhea. Patient reports drinking shots of liquor today and has been drinking to help her pain.

## 2013-01-26 NOTE — ED Provider Notes (Signed)
History/physical exam/procedure(s) were performed by non-physician practitioner and as supervising physician I was immediately available for consultation/collaboration. I have reviewed all notes and am in agreement with care and plan.   Montrelle Eddings S Ellamae Lybeck, MD 01/26/13 2234 

## 2013-01-26 NOTE — ED Notes (Signed)
[id number]<GN> Expected date:<BR> Expected time:<BR> Means of arrival:<BR> Comments:<BR> ems- abdominal pain

## 2013-01-31 ENCOUNTER — Encounter (HOSPITAL_COMMUNITY): Payer: Self-pay

## 2013-01-31 ENCOUNTER — Inpatient Hospital Stay (HOSPITAL_COMMUNITY)
Admission: AD | Admit: 2013-01-31 | Discharge: 2013-01-31 | Disposition: A | Discharge status: Home / Self Care | Payer: Medicaid Other | Source: Ambulatory Visit | Attending: Obstetrics & Gynecology | Admitting: Obstetrics & Gynecology

## 2013-01-31 DIAGNOSIS — R109 Unspecified abdominal pain: Secondary | ICD-10-CM | POA: Insufficient documentation

## 2013-01-31 DIAGNOSIS — N949 Unspecified condition associated with female genital organs and menstrual cycle: Secondary | ICD-10-CM | POA: Insufficient documentation

## 2013-01-31 DIAGNOSIS — D259 Leiomyoma of uterus, unspecified: Secondary | ICD-10-CM | POA: Insufficient documentation

## 2013-01-31 LAB — URINALYSIS, ROUTINE W REFLEX MICROSCOPIC
Glucose, UA: NEGATIVE mg/dL
Leukocytes, UA: NEGATIVE
Nitrite: NEGATIVE
Specific Gravity, Urine: >1.03 — ABNORMAL HIGH (ref 1.005–1.030)
pH: 6 (ref 5.0–8.0)

## 2013-01-31 LAB — URINE MICROSCOPIC-ADD ON

## 2013-01-31 MED ORDER — TRAMADOL HCL 50 MG PO TABS: 100.0000 mg | ORAL_TABLET | Freq: Four times a day (QID) | ORAL | Status: DC | PRN

## 2013-01-31 MED ORDER — KETOROLAC TROMETHAMINE 60 MG/2ML IM SOLN
60.0000 mg | Freq: Once | INTRAMUSCULAR | Status: AC
  Administered 2013-01-31: 60 mg via INTRAMUSCULAR
  Filled 2013-01-31: qty 2

## 2013-01-31 MED ORDER — TRAMADOL HCL 50 MG PO TABS
100.0000 mg | ORAL_TABLET | Freq: Once | ORAL | Status: AC
  Administered 2013-01-31: 100 mg via ORAL
  Filled 2013-01-31: qty 2

## 2013-01-31 NOTE — MAU Note (Signed)
Patient is brought in by EMS with c/o severe abdominal pain. She states that she was diagnosed with large uterine fibroids but she missed her "plan of care" appointment on Monday at the [facility] clinics. She states that she was at [facility] long recently but was given any medications. Patient states that she does not have any pain medications. She states that she drinks 3-5 shots of vodka to help her pain. Patient states that she drank 3 shots this morning and did not get any relief. Denies vaginal bleeding or abnormal discharge.

## 2013-01-31 NOTE — MAU Provider Note (Signed)
History     CSN: [id number]  Arrival date and time: 01/31/13 1028   None     Chief Complaint  Patient presents with  . Abdominal Pain   HPI Pt is 47 year old non pregnant female with known ~7cm fibroid.  Pt arrive via EMS with severe abdominal pain.  Pt was seen 01/26/2013 at [facility] wwith evalaution as well as GC/CHlamydia cultures.   Peace Thompson Caul, RN Registered Nurse Signed  MAU Note Service date: 01/31/2013 10:36 AM   Patient is brought in by EMS with c/o severe abdominal pain. She states that she was diagnosed with large uterine fibroids but she missed her "plan of care" appointment on Monday at the [facility]- clinics. She states that she was at [facility] long recently but was given any medications. Patient states that she does not have any pain medications. She states that she drinks 3-5 shots of vodka to help her pain. Patient states that she drank 3 shots this morning and did not get any relief. Denies vaginal bleeding or abnormal discharge.      Past Medical History  Diagnosis Date  . Depression   . Asthma   . Hypertension   . Fibroids     No past surgical history on file.  Family History  Problem Relation Age of Onset  . Cancer Mother     lung  . Cancer Father     History  Substance Use Topics  . Smoking status: Current Every Day Smoker -- 0.50 packs/day    Types: Cigarettes  . Smokeless tobacco: Never Used  . Alcohol Use: Yes     Comment: 3 shots alcohol today.     Allergies: No Known Allergies  Prescriptions prior to admission  Medication Sig Dispense Refill  . acetaminophen (TYLENOL) 500 MG tablet Take 1 tablet (500 mg total) by mouth every 6 (six) hours as needed for pain.  30 tablet  0  . albuterol (PROVENTIL HFA;VENTOLIN HFA) 108 (90 BASE) MCG/ACT inhaler Inhale 2 puffs into the lungs every 6 (six) hours as needed for wheezing.      . ondansetron (ZOFRAN) 4 MG tablet Take 1 tablet (4 mg total) by mouth every 6 (six) hours.  12 tablet  0  . pantoprazole  (PROTONIX) 20 MG tablet Take 1 tablet (20 mg total) by mouth daily.  30 tablet  1    Review of Systems  Constitutional: Negative for fever and chills.  Gastrointestinal: Positive for abdominal pain. Negative for diarrhea and constipation.  Genitourinary: Negative for dysuria and urgency.   Physical Exam   Blood pressure 123/70, pulse 109, temperature 98.3 F (36.8 C), temperature source Oral, resp. rate 20, last menstrual period 12/29/2012, SpO2 95.00%.  Physical Exam  Nursing note and vitals reviewed. Constitutional: She is oriented to person, place, and time. She appears well-developed and well-nourished. She appears distressed.  HENT:  Head: Normocephalic.  Eyes: Pupils are equal, round, and reactive to light.  Neck: Normal range of motion. Neck supple.  Cardiovascular: Normal rate.   Respiratory: Effort normal.  GI: Soft. Bowel sounds are normal. She exhibits no distension. There is tenderness. There is guarding. There is no rebound.  Musculoskeletal: Normal range of motion.  Neurological: She is alert and oriented to person, place, and time.  Skin: Skin is warm and dry.  Psychiatric: She has a normal mood and affect.    MAU Course  Procedures Discussed with pt importance of following up with GYN clinic- that she cannot keep coming in  for pain med Toradol 60 mg IM given and Tramadol after consult with pharmacy since pt had had 4 shots of Vodka    Assessment and Plan  Pelvic pain Fibroids F/u in GYN clinic- will send note to clinic  [facility] 01/31/2013, 10:43 AM

## 2013-02-04 ENCOUNTER — Encounter (HOSPITAL_COMMUNITY): Payer: Self-pay | Admitting: *Deleted

## 2013-02-04 ENCOUNTER — Inpatient Hospital Stay (HOSPITAL_COMMUNITY)
Admission: AD | Admit: 2013-02-04 | Discharge: 2013-02-04 | Disposition: A | Discharge status: Home / Self Care | Payer: Medicaid Other | Source: Ambulatory Visit | Attending: Obstetrics & Gynecology | Admitting: Obstetrics & Gynecology

## 2013-02-04 DIAGNOSIS — D259 Leiomyoma of uterus, unspecified: Secondary | ICD-10-CM | POA: Insufficient documentation

## 2013-02-04 DIAGNOSIS — R109 Unspecified abdominal pain: Secondary | ICD-10-CM

## 2013-02-04 MED ORDER — HYDROCODONE-ACETAMINOPHEN 5-325 MG PO TABS: 1.0000 | ORAL_TABLET | Freq: Four times a day (QID) | ORAL | Status: DC | PRN

## 2013-02-04 MED ORDER — KETOROLAC TROMETHAMINE 60 MG/2ML IM SOLN
60.0000 mg | Freq: Once | INTRAMUSCULAR | Status: AC
  Administered 2013-02-04: 60 mg via INTRAMUSCULAR
  Filled 2013-02-04: qty 2

## 2013-02-04 NOTE — MAU Provider Note (Signed)
History     CSN: [id number]  Arrival date and time: 02/04/13 1439   First Provider Initiated Contact with Patient 02/04/13 1548      Chief Complaint  Patient presents with  . Abdominal Pain   HPI Leslie [name] is a 47 y.o. [id number]  Presenting with c/o abd pain. She has known 7 cm uterine fibroid, being followed in GYN clinic. This is her 3rd ED visit for pain in 10 days- has Tramadol, but it doesn't work well and wears off quickly. She uses Vodka shots to manage her pain.  No bleeding or other changes. Next GYN clinic appt is 8/2, to be scheduled for surgical tx sometime in August.   OB History   Grav Para Term Preterm Abortions TAB SAB Ect Mult Living   6 3 3  0 3  3 0 0 3      Past Medical History  Diagnosis Date  . Depression   . Asthma   . Hypertension   . Fibroids     Past Surgical History  Procedure Laterality Date  . Dilation and curettage of uterus      Family History  Problem Relation Age of Onset  . Cancer Mother     lung  . Cancer Father     History  Substance Use Topics  . Smoking status: Current Every Day Smoker -- 2.00 packs/day for 27 years    Types: Cigarettes  . Smokeless tobacco: Never Used  . Alcohol Use: 1.8 oz/week    3 Shots of liquor per week     Comment: VODKA 3 shots alcohol today, 04 Feb 2013    Allergies: No Known Allergies  Prescriptions prior to admission  Medication Sig Dispense Refill  . acetaminophen (TYLENOL) 500 MG tablet Take 1 tablet (500 mg total) by mouth every 6 (six) hours as needed for pain.  30 tablet  0  . albuterol (PROVENTIL HFA;VENTOLIN HFA) 108 (90 BASE) MCG/ACT inhaler Inhale 2 puffs into the lungs every 6 (six) hours as needed for wheezing.      . ondansetron (ZOFRAN) 4 MG tablet Take 1 tablet (4 mg total) by mouth every 6 (six) hours.  12 tablet  0  . pantoprazole (PROTONIX) 20 MG tablet Take 1 tablet (20 mg total) by mouth daily.  30 tablet  1  . traMADol (ULTRAM) 50 MG tablet Take 2 tablets (100 mg total)  by mouth every 6 (six) hours as needed for pain.  15 tablet  0    Review of Systems  Constitutional: Negative for fever and chills.  Gastrointestinal: Positive for abdominal pain. Negative for nausea, vomiting, diarrhea and constipation.  Genitourinary: Negative for dysuria, urgency and frequency.   Physical Exam   Blood pressure 119/78, pulse 125, temperature 97.9 F (36.6 C), temperature source Oral, resp. rate 18, last menstrual period 12/29/2012.  Physical Exam  Constitutional: She is oriented to person, place, and time. She appears well-developed and well-nourished.  GI: Soft. Bowel sounds are normal. She exhibits no distension. There is tenderness. There is no rebound and no guarding.  Neurological: She is alert and oriented to person, place, and time.  Skin: Skin is warm and dry.  Psychiatric: She has a normal mood and affect. Her behavior is normal.    MAU Course  Procedures  MDM  Assessment and Plan  ASSESSMENT: Pain from uterine fibroid, awaiting tx Pt reports complete relief withToradol Told pt unable to Rx narcotic with her alcohol use. Pt won't need the alcohol if pain  better. Contract signed -no achohol use with narcotic. Pt states has apt 8/2, not in computer appt records. I e mailed GYN clinic to expect call from pt- needs appt this week for pain management.  PLAN:  Rx Vicodin # 12 Pt responsible for calling the clinic for an appt for pain management this week     Jeris Easterly M. 02/04/2013, 4:03 PM

## 2013-02-04 NOTE — MAU Note (Signed)
Pt does not like the way tramadol makes her feel.  Has not wanted to take narcotics but says she is willing now to take narcotics.  Says she will see her family Dr. Sharen Hones Wednesday but cannot wait til then because of the pain; and reports her surgery is scheduled in august sometime.

## 2013-02-04 NOTE — MAU Note (Signed)
Pt reports having mid abd pain x 6  Weeks. Told she has fibroids. Pt taking tylenol without relief. Pt states she does not take narcotics. Was taking tramadol without relief as well. Scheduled for appointment with [facility] to [facility] surgery next month.

## 2013-02-05 ENCOUNTER — Telehealth: Payer: Self-pay | Admitting: *Deleted

## 2013-02-05 MED ORDER — DICLOFENAC SODIUM 75 MG PO TBEC: 75.0000 mg | DELAYED_RELEASE_TABLET | Freq: Two times a day (BID) | ORAL | Status: DC

## 2013-02-05 NOTE — Telephone Encounter (Addendum)
Pt called and stated that she is not able to take the Vicodin which was prescribed because she is allergic to codeine. She further states that she has been sick all day, is shaky and almost called EMS because of how she is feeling after taking the Vicodin. She states that she forgot to tell MAU staff yesterday that she has codeine allergy and she did not know that Vicodin contains codeine until after she had taken it.  She denies taking vodka today for pain control and states that she was told yesterday not to use the vodka.  She reports having pain, wants alternate pain med and does not want to go back to MAU.  I advised pt that I will speak with MD for care recommendation and then call her back. Pt voiced understanding.  I consulted with Dr. Macon Large and was advised to recommend that pt take diclofenac BID and tramadol. Pt to be informed that other narcotic pain meds also contain derivatives of codeine. (Dr. Macon Large does not want to give Dilaudid at this time).  Pt also may be withdrawing from alcohol use. I called pt and advised her of recommendations from Dr. Macon Large. Pt agreed to try the Diclofenac and Tramadol. She stated that if her pain gets worse, she will need to return to MAU. I agreed that is what she will need to do. I also stated that I will check tomorrow to see if her appt can be changed to an earlier date than 8/18.  Pt voiced understanding.  7/15  1515- Called pt and left message that I have moved her appt to 8/11 @ 1445.

## 2013-03-05 ENCOUNTER — Encounter: Payer: Self-pay | Admitting: Obstetrics & Gynecology

## 2013-03-05 ENCOUNTER — Encounter: Payer: Self-pay | Admitting: Internal Medicine

## 2013-03-05 ENCOUNTER — Ambulatory Visit (INDEPENDENT_AMBULATORY_CARE_PROVIDER_SITE_OTHER): Payer: Medicaid Other | Admitting: Obstetrics & Gynecology

## 2013-03-05 VITALS — BP 129/88 | HR 88 | Temp 98.7°F | Ht 62.0 in | Wt 158.6 lb

## 2013-03-05 DIAGNOSIS — K700 Alcoholic fatty liver: Secondary | ICD-10-CM | POA: Insufficient documentation

## 2013-03-05 DIAGNOSIS — D259 Leiomyoma of uterus, unspecified: Secondary | ICD-10-CM

## 2013-03-05 NOTE — Patient Instructions (Signed)
Fibroids Fibroids are lumps (tumors) that can occur any place in a woman's body. These lumps are not cancerous. Fibroids vary in size, weight, and where they grow. HOME CARE  Do not take aspirin.  Write down the number of pads or tampons you use during your period. Tell your doctor. This can help determine the best treatment for you. GET HELP RIGHT AWAY IF:  You have pain in your lower belly (abdomen) that is not helped with medicine.  You have cramps that are not helped with medicine.  You have more bleeding between or during your period.  You feel lightheaded or pass out (faint).  Your lower belly pain gets worse. MAKE SURE YOU:  Understand these instructions.  Will watch your condition.  Will get help right away if you are not doing well or get worse. Document Released: 08/14/2010 Document Revised: 10/04/2011 Document Reviewed: 08/14/2010 [employer] Patient Information 2014 [employer].  

## 2013-03-05 NOTE — Progress Notes (Signed)
Patient ID: Leslie [name], female   DOB: 1965/10/04, 47 y.o.   MRN: [id number]  Chief Complaint  Patient presents with  . Follow-up    pelvic pain/fibroids    HPI Leslie [name] is a 47 y.o. female.  Patient's last menstrual period was 01/29/2013.Menses lasted 6 days and was normal with no pain. Intermittent diffuse abdominal pain, some nausea.    HPI  Past Medical History  Diagnosis Date  . Depression   . Asthma   . Hypertension   . Fibroids     Past Surgical History  Procedure Laterality Date  . Dilation and curettage of uterus      Family History  Problem Relation Age of Onset  . Cancer Mother     lung  . Cancer Father     Social History History  Substance Use Topics  . Smoking status: Current Every Day Smoker -- 2.00 packs/day for 27 years    Types: Cigarettes  . Smokeless tobacco: Never Used  . Alcohol Use: 1.8 oz/week    3 Shots of liquor per week     Comment: VODKA 3 shots alcohol today, 04 Feb 2013    No Known Allergies  Current Outpatient Prescriptions  Medication Sig Dispense Refill  . albuterol (PROVENTIL HFA;VENTOLIN HFA) 108 (90 BASE) MCG/ACT inhaler Inhale 2 puffs into the lungs every 6 (six) hours as needed for wheezing.      . diclofenac (VOLTAREN) 75 MG EC tablet Take 1 tablet (75 mg total) by mouth 2 (two) times daily.  60 tablet  0  . HYDROcodone-acetaminophen (NORCO/VICODIN) 5-325 MG per tablet Take 1 tablet by mouth every 6 (six) hours as needed for pain (1-2 tablets Q 6 hr prn pain).  12 tablet  0  . ondansetron (ZOFRAN) 4 MG tablet Take 1 tablet (4 mg total) by mouth every 6 (six) hours.  12 tablet  0  . pantoprazole (PROTONIX) 20 MG tablet Take 1 tablet (20 mg total) by mouth daily.  30 tablet  1  . traMADol (ULTRAM) 50 MG tablet Take 2 tablets (100 mg total) by mouth every 6 (six) hours as needed for pain.  15 tablet  0   No current facility-administered medications for this visit.    Review of Systems Review of Systems  Constitutional:  Negative for fever.  Gastrointestinal: Positive for nausea, vomiting and abdominal pain. Negative for diarrhea.  Genitourinary: Positive for frequency and pelvic pain. Negative for dysuria, vaginal bleeding and vaginal discharge.    Blood pressure 129/88, pulse 88, temperature 98.7 F (37.1 C), temperature source Oral, height 5\' 2"  (1.575 m), weight 158 lb 9.6 oz (71.94 kg), last menstrual period 01/29/2013.  Physical Exam Physical Exam  Constitutional: She is oriented to person, place, and time. She appears well-developed. No distress.  Pulmonary/Chest: Effort normal. No respiratory distress.  Abdominal: She exhibits no distension and no mass. There is tenderness (generalized). There is no guarding.  Genitourinary:  deferred  Neurological: She is alert and oriented to person, place, and time.  Skin: Skin is warm and dry.  Psychiatric: She has a normal mood and affect. Her behavior is normal.    Data Reviewed *RADIOLOGY REPORT*  Clinical Data: Upper abdominal pain, elevated LFTs  COMPLETE ABDOMINAL ULTRASOUND  Comparison: None.  Findings:  Gallbladder: No gallstones, gallbladder wall thickening, or  pericholecystic fluid. Negative sonographic Murphy's sign.  Common bile duct: Poorly visualized, measuring 2 mm.  Liver: Coarse, echogenic hepatic parenchyma, suggesting hepatic  steatosis. No focal hepatic lesion is seen.  IVC: Poorly visualized.  Pancreas: Poorly visualized due to overlying bowel gas.  Spleen: Measures 3.4 cm.  Right Kidney: Measures 12.3 cm. No mass or hydronephrosis.  Left Kidney: Measures 13.2 cm. No mass or hydronephrosis.  Abdominal aorta: No aneurysm identified.  IMPRESSION:  Suspected hepatic steatosis.  CMP     Component Value Date/Time   NA 140 01/26/2013 1425   K 3.6 01/26/2013 1425   CL 104 01/26/2013 1425   CO2 23 01/26/2013 1425   GLUCOSE 74 01/26/2013 1425   BUN 8 01/26/2013 1425   CREATININE 0.54 01/26/2013 1425   CALCIUM 8.6 01/26/2013 1425   PROT 7.3  01/26/2013 1425   ALBUMIN 3.4* 01/26/2013 1425   AST 33 01/26/2013 1425   ALT 36* 01/26/2013 1425   ALKPHOS 74 01/26/2013 1425   BILITOT 0.3 01/26/2013 1425   GFRNONAA >90 01/26/2013 1425   GFRAA >90 01/26/2013 1425    Previous LFT elevated   Assessment    Fibroid uterus, last period was normal Abdominal pain, nausea, H/O alcohol abuse, abnormal liver findings on [place], suspected fatty liver   smoker  Plan    GI referral RTC 6 month  Observe for s/sx fibroids    Advised against Etoh, tobacco, narcotic abuse    Leslie [name] 03/05/2013, 3:06 PM

## 2013-03-12 ENCOUNTER — Encounter: Payer: Medicaid Other | Admitting: Obstetrics & Gynecology

## 2013-04-09 ENCOUNTER — Encounter: Payer: Self-pay | Admitting: Internal Medicine

## 2013-04-09 ENCOUNTER — Other Ambulatory Visit (INDEPENDENT_AMBULATORY_CARE_PROVIDER_SITE_OTHER): Payer: Medicaid Other

## 2013-04-09 ENCOUNTER — Ambulatory Visit (INDEPENDENT_AMBULATORY_CARE_PROVIDER_SITE_OTHER): Payer: Medicaid Other | Admitting: Internal Medicine

## 2013-04-09 VITALS — BP 120/80 | HR 95 | Ht 65.0 in | Wt 163.6 lb

## 2013-04-09 DIAGNOSIS — K760 Fatty (change of) liver, not elsewhere classified: Secondary | ICD-10-CM

## 2013-04-09 DIAGNOSIS — F1010 Alcohol abuse, uncomplicated: Secondary | ICD-10-CM

## 2013-04-09 DIAGNOSIS — K7689 Other specified diseases of liver: Secondary | ICD-10-CM

## 2013-04-09 LAB — HEPATIC FUNCTION PANEL
ALT: 22 U/L (ref 0–35)
AST: 15 U/L (ref 0–37)
Albumin: 3.6 g/dL (ref 3.5–5.2)
Total Protein: 7.3 g/dL (ref 6.0–8.3)

## 2013-04-09 NOTE — Patient Instructions (Addendum)
  Your physician has requested that you go to the basement for the following lab work before leaving today: Liver Function Panel   Please go to the lab tomorrow at 7:30 am for a Lipid panel. Please have nothing to eat or drink after midnight.  You will be due for a recall colonoscopy in 03-2014. We will send you a reminder in the mail when it gets closer to that time.  We will call you with follow up information.  Avoid alcohol as you are doing.  Information on fatty liver was given today. _______________________________________________________________________________________________                                               We are excited to introduce MyChart, a new best-in-class service that provides you online access to important information in your electronic medical record. We want to make it easier for you to view your health information - all in one secure location - when and where you need it. We expect MyChart will enhance the quality of care and service we provide.  When you register for MyChart, you can:    View your test results.    Request appointments and receive appointment reminders via email.    Request medication renewals.    View your medical history, allergies, medications and immunizations.    Communicate with your physician's office through a password-protected site.    Conveniently print information such as your medication lists.  To find out if MyChart is right for you, please talk to a member of our clinical staff today. We will gladly answer your questions about this free health and wellness tool.  If you are age 37 or older and want a member of your family to have access to your record, you must provide written consent by completing a proxy form available at our office. Please speak to our clinical staff about guidelines regarding accounts for patients younger than age 94.  As you activate your MyChart account and need any technical assistance,  please call the MyChart technical support line at [phone]) or email your question to [email or web address]. If you email your question(s), please include your name, a return phone number and the best time to reach you.  If you have non-urgent health-related questions, you can send a message to our office through MyChart at [email or web address]. If you have a medical emergency, call 911.  Thank you for using MyChart as your new health and wellness resource!   MyChart licensed from [employer],  [phone]. Patents Pending.

## 2013-04-09 NOTE — Progress Notes (Signed)
  Subjective:    Patient ID: Icey Tello, female    DOB: 06/26/1966, 47 y.o.   MRN: [id number]  HPI The patient is here because of a hx abnormal LFT's and CT scan has suggested fatty liver. She has hx of fairly heavy EtOH consumption and ED records and labs support this. She has stopped drinking in recent weeks. LFT's have improved.  She denies absominal pain   Review of Systems As per HOI    Objective:   Physical Exam General:  Well-developed, well-nourished and in no acute distress Eyes:  anicteric. ENT:   Mouth and posterior pharynx free of lesions.  Neck:   supple w/o thyromegaly or mass.  Lungs: Clear to auscultation bilaterally. Heart:  S1S2, no rubs, murmurs, gallops. Abdomen:  soft, non-tender, no hepatosplenomegaly, hernia, or mass and BS+.  Lymph:  no cervical or supraclavicular adenopathy. Extremities:   no edema Skin   no rash. Neuro:  A&O x 3.  Psych:  appropriate mood and  Affect.   Data Reviewed: Dr. Jeannetta Nap records, labs, hosptial ED visits and labs    Assessment & Plan:  Fatty liver - NAFLD vs. EtOH or both  Alcohol abuse-history -   Stop EtOH Lipids and repeat lft's  LFTs were normal and total cholesterol high She is refreed back to PCP, may see me prn  NW:[id number],HYQMVH Joelene Millin, MD Scheryl Darter, MD

## 2013-04-10 ENCOUNTER — Other Ambulatory Visit (INDEPENDENT_AMBULATORY_CARE_PROVIDER_SITE_OTHER): Payer: Medicaid Other

## 2013-04-10 DIAGNOSIS — K7689 Other specified diseases of liver: Secondary | ICD-10-CM

## 2013-04-10 DIAGNOSIS — K760 Fatty (change of) liver, not elsewhere classified: Secondary | ICD-10-CM

## 2013-04-10 DIAGNOSIS — F1010 Alcohol abuse, uncomplicated: Secondary | ICD-10-CM

## 2013-04-10 LAB — LIPID PANEL
Cholesterol: 206 mg/dL — ABNORMAL HIGH (ref 0–200)
Total CHOL/HDL Ratio: 5
Triglycerides: 80 mg/dL (ref 0.0–149.0)

## 2013-04-10 NOTE — Progress Notes (Signed)
Quick Note:  Liver tests back to normal Stay off alcohol Please call her to inform  ______

## 2013-07-08 DIAGNOSIS — F410 Panic disorder [episodic paroxysmal anxiety] without agoraphobia: Secondary | ICD-10-CM | POA: Insufficient documentation

## 2013-07-08 DIAGNOSIS — F1320 Sedative, hypnotic or anxiolytic dependence, uncomplicated: Secondary | ICD-10-CM | POA: Insufficient documentation

## 2013-07-08 DIAGNOSIS — T7491XA Unspecified adult maltreatment, confirmed, initial encounter: Secondary | ICD-10-CM | POA: Insufficient documentation

## 2013-08-15 ENCOUNTER — Ambulatory Visit: Payer: Medicaid Other | Admitting: Internal Medicine

## 2013-09-03 ENCOUNTER — Emergency Department (HOSPITAL_COMMUNITY)
Admission: EM | Admit: 2013-09-03 | Discharge: 2013-09-03 | Disposition: A | Discharge status: Home / Self Care | Payer: Medicaid Other | Attending: Emergency Medicine | Admitting: Emergency Medicine

## 2013-09-03 ENCOUNTER — Ambulatory Visit (HOSPITAL_COMMUNITY)
Admission: RE | Admit: 2013-09-03 | Discharge: 2013-09-03 | Disposition: A | Discharge status: Home / Self Care | Payer: Medicaid Other | Attending: Psychiatry | Admitting: Psychiatry

## 2013-09-03 ENCOUNTER — Encounter (HOSPITAL_COMMUNITY): Payer: Self-pay | Admitting: Emergency Medicine

## 2013-09-03 ENCOUNTER — Emergency Department (HOSPITAL_COMMUNITY): Payer: Medicaid Other

## 2013-09-03 DIAGNOSIS — J45909 Unspecified asthma, uncomplicated: Secondary | ICD-10-CM | POA: Insufficient documentation

## 2013-09-03 DIAGNOSIS — F1020 Alcohol dependence, uncomplicated: Secondary | ICD-10-CM

## 2013-09-03 DIAGNOSIS — D259 Leiomyoma of uterus, unspecified: Secondary | ICD-10-CM | POA: Insufficient documentation

## 2013-09-03 DIAGNOSIS — G8929 Other chronic pain: Secondary | ICD-10-CM | POA: Insufficient documentation

## 2013-09-03 DIAGNOSIS — F410 Panic disorder [episodic paroxysmal anxiety] without agoraphobia: Secondary | ICD-10-CM | POA: Insufficient documentation

## 2013-09-03 DIAGNOSIS — F17200 Nicotine dependence, unspecified, uncomplicated: Secondary | ICD-10-CM | POA: Insufficient documentation

## 2013-09-03 DIAGNOSIS — S0990XA Unspecified injury of head, initial encounter: Secondary | ICD-10-CM | POA: Insufficient documentation

## 2013-09-03 DIAGNOSIS — Z79899 Other long term (current) drug therapy: Secondary | ICD-10-CM | POA: Insufficient documentation

## 2013-09-03 DIAGNOSIS — Y929 Unspecified place or not applicable: Secondary | ICD-10-CM | POA: Insufficient documentation

## 2013-09-03 DIAGNOSIS — S79929A Unspecified injury of unspecified thigh, initial encounter: Secondary | ICD-10-CM

## 2013-09-03 DIAGNOSIS — Y939 Activity, unspecified: Secondary | ICD-10-CM | POA: Insufficient documentation

## 2013-09-03 DIAGNOSIS — W19XXXA Unspecified fall, initial encounter: Secondary | ICD-10-CM | POA: Insufficient documentation

## 2013-09-03 DIAGNOSIS — Z3202 Encounter for pregnancy test, result negative: Secondary | ICD-10-CM | POA: Insufficient documentation

## 2013-09-03 DIAGNOSIS — S79919A Unspecified injury of unspecified hip, initial encounter: Secondary | ICD-10-CM | POA: Insufficient documentation

## 2013-09-03 DIAGNOSIS — I10 Essential (primary) hypertension: Secondary | ICD-10-CM | POA: Insufficient documentation

## 2013-09-03 LAB — CBC
HEMATOCRIT: 43.2 % (ref 36.0–46.0)
HEMOGLOBIN: 14.4 g/dL (ref 12.0–15.0)
MCH: 30.3 pg (ref 26.0–34.0)
MCHC: 33.3 g/dL (ref 30.0–36.0)
MCV: 90.9 fL (ref 78.0–100.0)
Platelets: 275 10*3/uL (ref 150–400)
RBC: 4.75 MIL/uL (ref 3.87–5.11)
RDW: 17.7 % — AB (ref 11.5–15.5)
WBC: 7.5 10*3/uL (ref 4.0–10.5)

## 2013-09-03 LAB — RAPID URINE DRUG SCREEN, HOSP PERFORMED
Amphetamines: NOT DETECTED
BENZODIAZEPINES: POSITIVE — AB
Barbiturates: NOT DETECTED
Cocaine: NOT DETECTED
OPIATES: NOT DETECTED
Tetrahydrocannabinol: NOT DETECTED

## 2013-09-03 LAB — POCT PREGNANCY, URINE: Preg Test, Ur: NEGATIVE

## 2013-09-03 LAB — COMPREHENSIVE METABOLIC PANEL
ALBUMIN: 3.4 g/dL — AB (ref 3.5–5.2)
ALT: 58 U/L — AB (ref 0–35)
AST: 62 U/L — AB (ref 0–37)
Alkaline Phosphatase: 78 U/L (ref 39–117)
BUN: 7 mg/dL (ref 6–23)
CALCIUM: 8.2 mg/dL — AB (ref 8.4–10.5)
CO2: 25 mEq/L (ref 19–32)
Chloride: 100 mEq/L (ref 96–112)
Creatinine, Ser: 0.62 mg/dL (ref 0.50–1.10)
GFR calc Af Amer: >90 mL/min (ref >90)
GFR calc non Af Amer: >90 mL/min (ref >90)
Glucose, Bld: 76 mg/dL (ref 70–99)
Potassium: 3.9 mEq/L (ref 3.7–5.3)
SODIUM: 140 meq/L (ref 137–147)
TOTAL PROTEIN: 7.3 g/dL (ref 6.0–8.3)
Total Bilirubin: 0.2 mg/dL — ABNORMAL LOW (ref 0.3–1.2)

## 2013-09-03 LAB — SALICYLATE LEVEL

## 2013-09-03 LAB — ETHANOL: Alcohol, Ethyl (B): 243 mg/dL — ABNORMAL HIGH (ref 0–11)

## 2013-09-03 LAB — ACETAMINOPHEN LEVEL

## 2013-09-03 MED ORDER — ONDANSETRON HCL 4 MG PO TABS: 4.0000 mg | ORAL_TABLET | Freq: Three times a day (TID) | ORAL | Status: DC | PRN

## 2013-09-03 MED ORDER — ZOLPIDEM TARTRATE 5 MG PO TABS: 5.0000 mg | ORAL_TABLET | Freq: Every evening | ORAL | Status: DC | PRN

## 2013-09-03 MED ORDER — LORAZEPAM 1 MG PO TABS
0.0000 mg | ORAL_TABLET | Freq: Four times a day (QID) | ORAL | Status: DC
  Administered 2013-09-03: 1 mg via ORAL
  Filled 2013-09-03: qty 1

## 2013-09-03 MED ORDER — LORAZEPAM 1 MG PO TABS: 0.0000 mg | ORAL_TABLET | Freq: Two times a day (BID) | ORAL | Status: DC

## 2013-09-03 MED ORDER — ALUM & MAG HYDROXIDE-SIMETH 200-200-20 MG/5ML PO SUSP: 30.0000 mL | ORAL | Status: DC | PRN

## 2013-09-03 MED ORDER — THIAMINE HCL 100 MG/ML IJ SOLN: 100.0000 mg | Freq: Every day | INTRAMUSCULAR | Status: DC

## 2013-09-03 MED ORDER — IBUPROFEN 200 MG PO TABS
600.0000 mg | ORAL_TABLET | Freq: Three times a day (TID) | ORAL | Status: DC | PRN
  Administered 2013-09-03: 600 mg via ORAL
  Filled 2013-09-03: qty 3

## 2013-09-03 MED ORDER — LORAZEPAM 1 MG PO TABS: 1.0000 mg | ORAL_TABLET | Freq: Three times a day (TID) | ORAL | Status: DC | PRN

## 2013-09-03 MED ORDER — VITAMIN B-1 100 MG PO TABS
100.0000 mg | ORAL_TABLET | Freq: Every day | ORAL | Status: DC
  Administered 2013-09-03: 100 mg via ORAL
  Filled 2013-09-03: qty 1

## 2013-09-03 MED ORDER — NICOTINE 21 MG/24HR TD PT24
21.0000 mg | MEDICATED_PATCH | Freq: Every day | TRANSDERMAL | Status: DC
  Administered 2013-09-03: 21 mg via TRANSDERMAL
  Filled 2013-09-03: qty 1

## 2013-09-03 NOTE — ED Provider Notes (Signed)
CSN: [id number]     Arrival date & time 09/03/13  0457 History   First MD Initiated Contact with Patient 09/03/13 [phone]     Chief Complaint  Patient presents with  . Medical Clearance   (Consider location/radiation/quality/duration/timing/severity/associated sxs/prior Treatment) The history is provided by the patient.   Patient presents for alcohol detox.  States she has been drinking a pint of liquor and several beers daily since Thanksgiving.  Last drink was early this morning.  Denies drug use.  Denies depression, SI, HI.  Has chronic pelvic pain from fibroids, this pain is unchanged today.  Also states she is having left hip pain and headache since fall two days ago - called 911 and paramedics came but she refused transport because she wanted to drink alcohol. No other complaints.   Past Medical History  Diagnosis Date  . Depression   . Asthma   . Hypertension   . Fibroids   . OSA (obstructive sleep apnea)   . Panic attacks   . Anxiety    Past Surgical History  Procedure Laterality Date  . Dilation and curettage of uterus     Family History  Problem Relation Age of Onset  . Lung cancer Mother   . Cancer Father    History  Substance Use Topics  . Smoking status: Current Every Day Smoker -- 2.00 packs/day for 27 years    Types: Cigarettes  . Smokeless tobacco: Never Used  . Alcohol Use: No     Comment: VODKA 3 shots alcohol today, 04 Feb 2013   OB History   Grav Para Term Preterm Abortions TAB SAB Ect Mult Living   6 3 3  0 3  3 0 0 3     Review of Systems  Constitutional: Negative for fever and chills.  Respiratory: Negative for cough and shortness of breath.   Cardiovascular: Negative for chest pain.  Gastrointestinal: Positive for abdominal pain. Negative for nausea, vomiting and diarrhea.  Genitourinary: Positive for pelvic pain. Negative for dysuria, urgency, frequency, vaginal bleeding and vaginal discharge.  Psychiatric/Behavioral: Negative for suicidal ideas  and dysphoric mood.  All other systems reviewed and are negative.    Allergies  Review of patient's allergies indicates no known allergies.  Home Medications   Current Outpatient Rx  Name  Route  Sig  Dispense  Refill  . albuterol (PROAIR HFA) 108 (90 BASE) MCG/ACT inhaler   Inhalation   Inhale 2 puffs into the lungs every 6 (six) hours as needed for wheezing or shortness of breath.          Marland Kitchen LORazepam (ATIVAN) 2 MG tablet   Oral   Take 0.5-1 tablets by mouth every 8 (eight) hours as needed for anxiety.           BP 137/85  Pulse 92  Temp(Src) 98 F (36.7 C) (Oral)  Resp 18  SpO2 96%  LMP 08/03/2013 Physical Exam  Nursing note and vitals reviewed. Constitutional: She appears well-developed and well-nourished. No distress.  HENT:  Head: Normocephalic and atraumatic.  Neck: Neck supple.  Cardiovascular: Normal rate and regular rhythm.   Pulmonary/Chest: Effort normal and breath sounds normal. No respiratory distress. She has no wheezes. She has no rales.  Abdominal: Soft. She exhibits no distension. There is no tenderness. There is no rebound and no guarding.  Musculoskeletal: Normal range of motion. She exhibits no edema and no tenderness.  Neurological: She is alert. She has normal strength. No cranial nerve deficit or sensory deficit. She  exhibits normal muscle tone. GCS eye subscore is 4. GCS verbal subscore is 5. GCS motor subscore is 6.  Skin: She is not diaphoretic.    ED Course  Procedures (including critical care time) Labs Review Labs Reviewed  CBC - Abnormal; Notable for the following:    RDW 17.7 (*)    All other components within normal limits  COMPREHENSIVE METABOLIC PANEL - Abnormal; Notable for the following:    Calcium 8.2 (*)    Albumin 3.4 (*)    AST 62 (*)    ALT 58 (*)    Total Bilirubin 0.2 (*)    All other components within normal limits  ETHANOL - Abnormal; Notable for the following:    Alcohol, Ethyl (B) 243 (*)    All other  components within normal limits  SALICYLATE LEVEL - Abnormal; Notable for the following:    Salicylate Lvl <5.8 (*)    All other components within normal limits  URINE RAPID DRUG SCREEN (HOSP PERFORMED) - Abnormal; Notable for the following:    Benzodiazepines POSITIVE (*)    All other components within normal limits  ACETAMINOPHEN LEVEL  POCT PREGNANCY, URINE   Imaging Review Dg Cervical Spine Complete  09/03/2013   CLINICAL DATA:  Neck pain  EXAM: CERVICAL SPINE  4+ VIEWS  COMPARISON:  None.  FINDINGS: There is no evidence of cervical spine fracture or prevertebral soft tissue swelling. Alignment is normal. No other significant bone abnormalities are identified.  IMPRESSION: Negative cervical spine radiographs.   Electronically Signed   By: Kathreen Devoid   On: 09/03/2013 07:59   Dg Hip Complete Left  09/03/2013   CLINICAL DATA:  Left hip pain.  EXAM: LEFT HIP - COMPLETE 2+ VIEW  COMPARISON:  None.  FINDINGS: There is no evidence of hip fracture or dislocation. There is no evidence of arthropathy or other focal bone abnormality.  IMPRESSION: Normal left hip.   Electronically Signed   By: Rozetta Nunnery M.D.   On: 09/03/2013 08:00    EKG Interpretation   None       MDM   1. Alcohol dependence     Pt with alcohol abuse, presenting for detox.  No depression or SI or HI.  She is voluntary.  Has already been seen at behavioral health and sent for med clearance.  ETOH 243, positive for benzos. Also reports fall two days ago.  Neurologically intact.  No apparent injuries noted.  C/O left hip pain and headache (but holds her neck when she says this),  xrays negative.      [place], PA-C 09/03/13 [phone]

## 2013-09-03 NOTE — ED Notes (Signed)
Patient transported to X-ray 

## 2013-09-03 NOTE — ED Provider Notes (Signed)
Medical screening examination/treatment/procedure(s) were performed by non-physician practitioner and as supervising physician I was immediately available for consultation/collaboration.  Carmin Muskrat, MD 09/03/13 [phone]
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
## 2013-09-03 NOTE — ED Notes (Signed)
Pt arrived from [facility] where she tried to check in for alcohol detox.  Pt states she has a fibroid but refuses to go to a pain clinic so she kills the pain with alcohol.  Pt's last drink was around 0200 hrs.  Pt states if she stops drinking she will "start to shake and die."

## 2013-09-03 NOTE — BHH Counselor (Addendum)
Pt denies SI and HI. She denies [facility] and no delusions noted. Cousin is bedside and he states he will spend the day with her. Cousin will drive pt home. Probation officer provided pt w/ following resources: [employer] [facility] [place] [address]     [place] [address] [place],  [place], [place]      [phone] Both a day and evening program   *Accepts MCD  [place].: Substance abuse treatment ctr [address]     [address] [place], [place] [address] [phone]      [phone]  [facility]: [facility]    [facility] - Intensive Outpatient [place] [address] [place], [place] [address]     [place], [place]      [place] [address], [place]. [place], [place] [address] [phone] *Accepts MCD      Residential Treatment Programs ASAP Residential Treatment    [facility] ([place] [address]     [address] [place], [place]      [phone] or [place]     The [address] (Several in [place]) [place] [address]    [place] [address]     [place], [place]      [phone]  [place]   [facility] ([facility]) [place]     [address] [place],  [address]     [place], [place]      [phone] Admissions: 8am-3pm M-F  Self-Help/Support Groups [facility] [place]   Narcotics Anonymous (NA) Variety of support groups    [facility] [phone] (call for more info)    [place] - 2 meetings at this location These referrals have been provided to you as appropriate for your clinical needs while  taking into account your financial concerns. Please be aware that agencies, practitioners and insurance companies sometimes change contracts. When calling to make an appointment have your insurance information available so the professional you are going to see can confirm whether they are covered by your plan. Take this form with you in case the person you are seeing needs a copy or to contact us.  __________________________________________ Assessment Counselor  Arnold Long, [place] Assessment Counselor
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
## 2013-09-03 NOTE — ED Notes (Signed)
Notified MD/NP of pt elevated BP, pt still wanted to be discharged, discussed alcohol abuse with patient and its affect on heart/BP, pt verbalized understanding, pt given list of treatment facilities, pt very persistent about wanting to be discharged, denies SI/HI/AVH, d/c'd with pt's cousin, pt cousin here to drive pt home.
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
## 2013-09-06 IMAGING — US US ABDOMEN COMPLETE
1 series · 14 of 25 positions shown · non-contrast
Comparison: None.

CLINICAL DATA: Upper abdominal pain, elevated LFTs

COMPLETE ABDOMINAL ULTRASOUND

[Series 1: us abdomen complete · 14 of 76 slices shown]
[im 1/76]
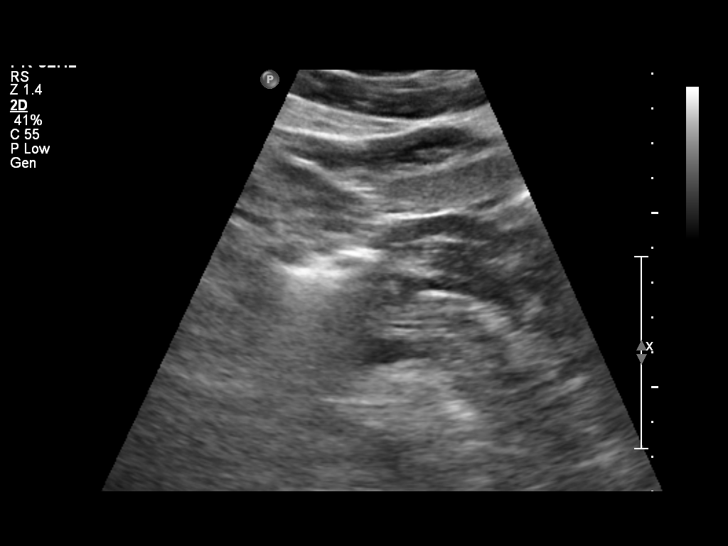
[im 7/76]
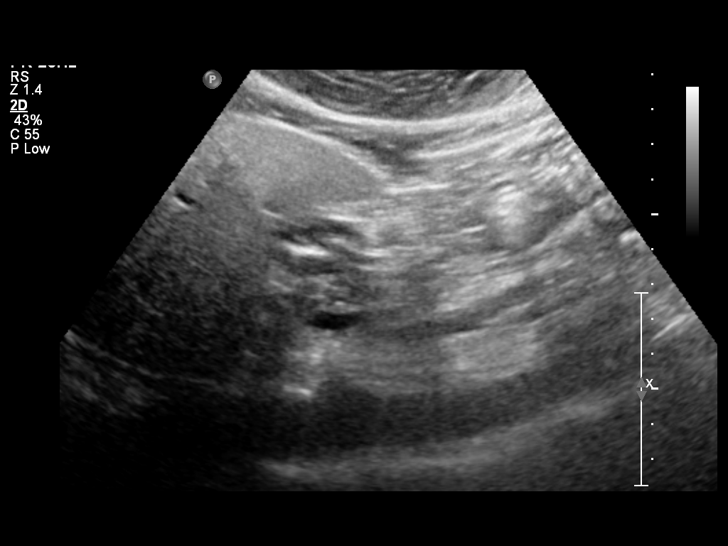
[im 13/76]
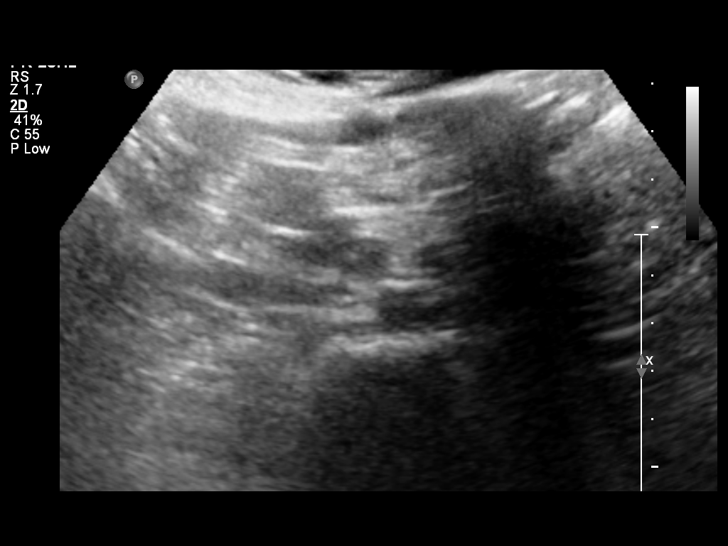
[im 19/76]
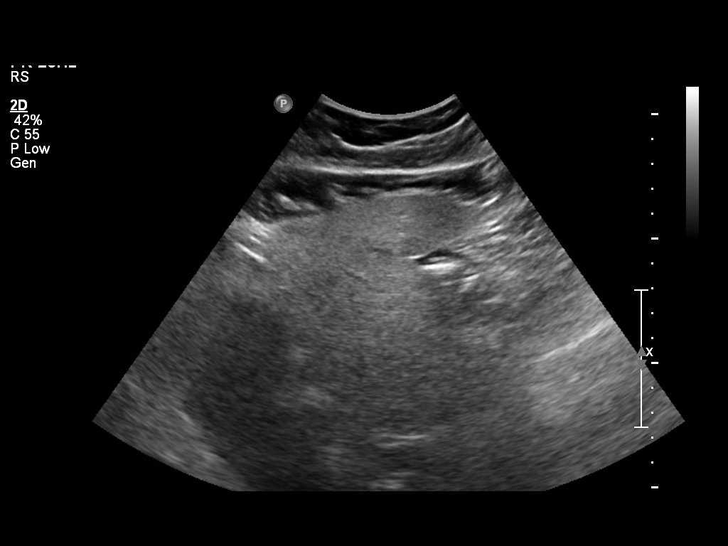
[im 26/76]
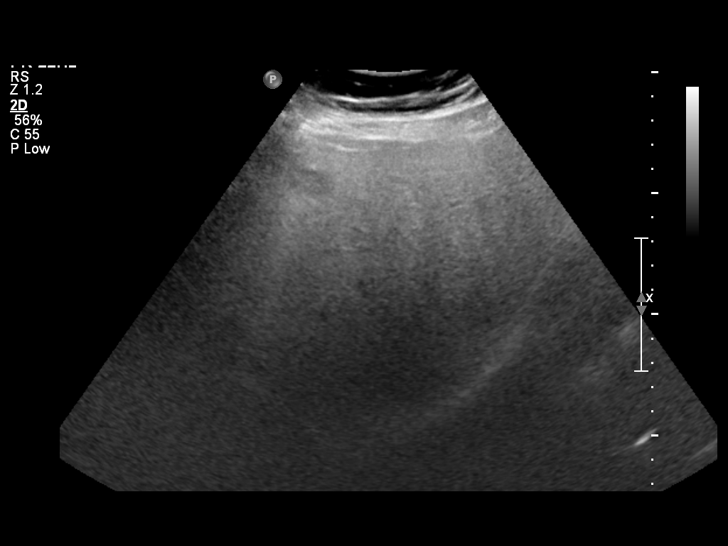
[im 29/76]
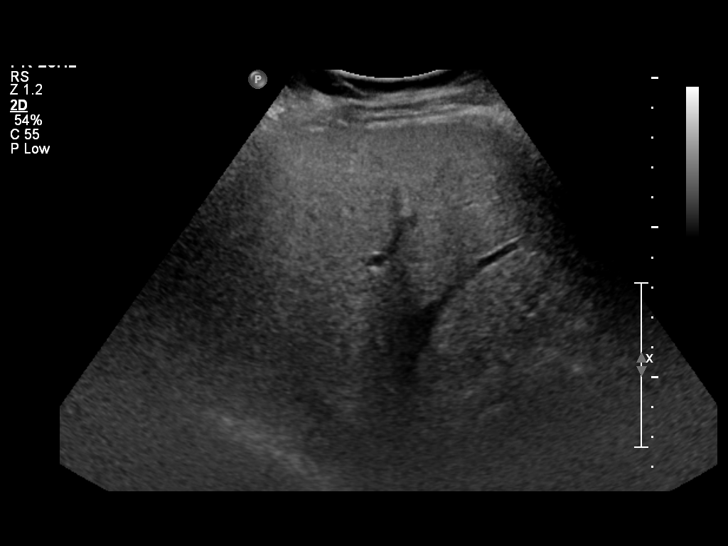
[im 35/76]
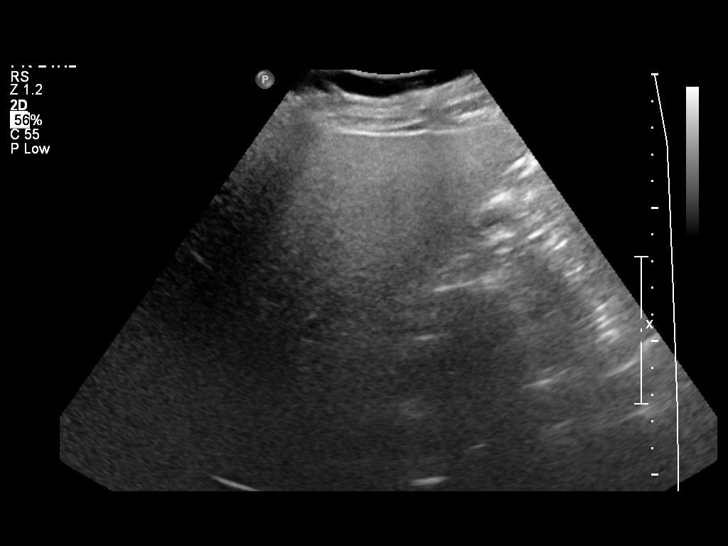
[im 41/76]
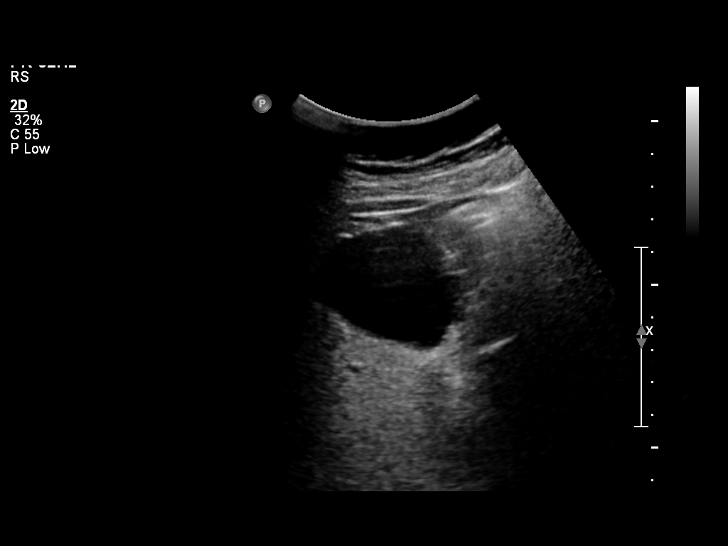
[im 47/76]
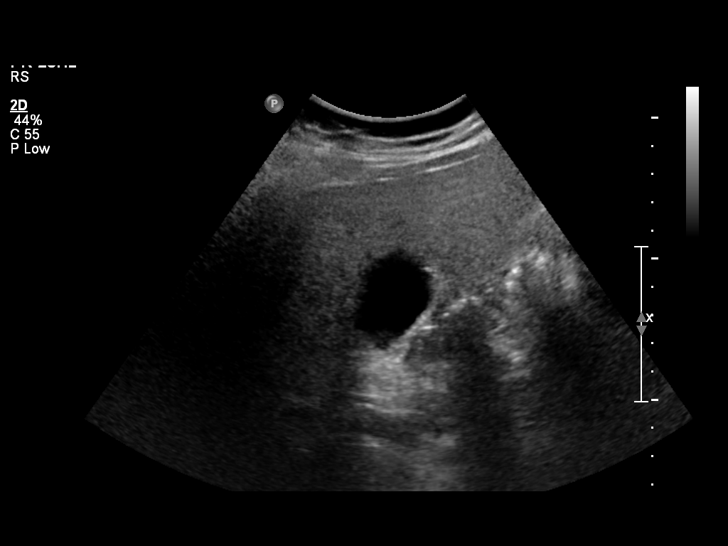
[im 51/76]
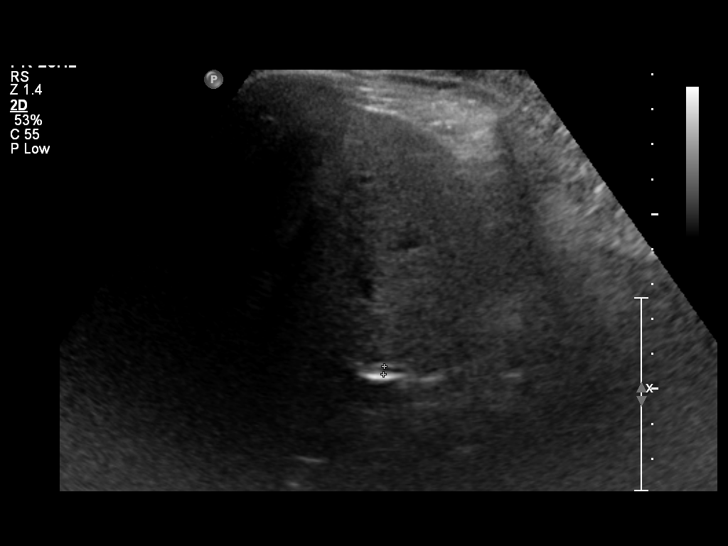
[im 57/76]
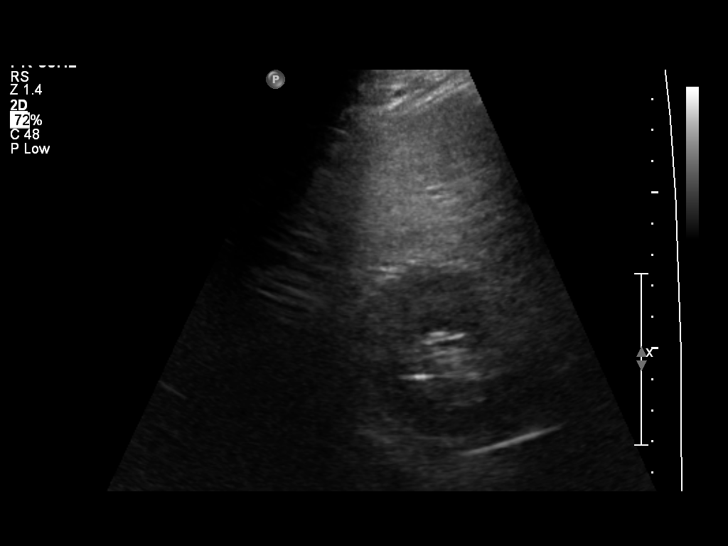
[im 63/76]
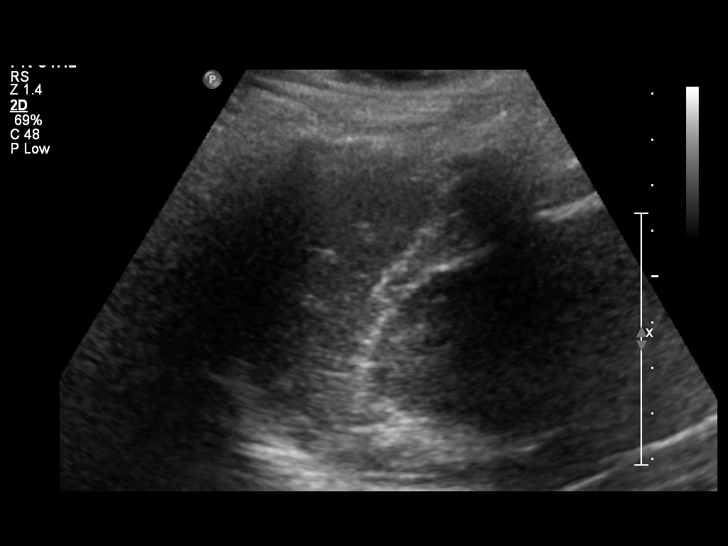
[im 69/76]
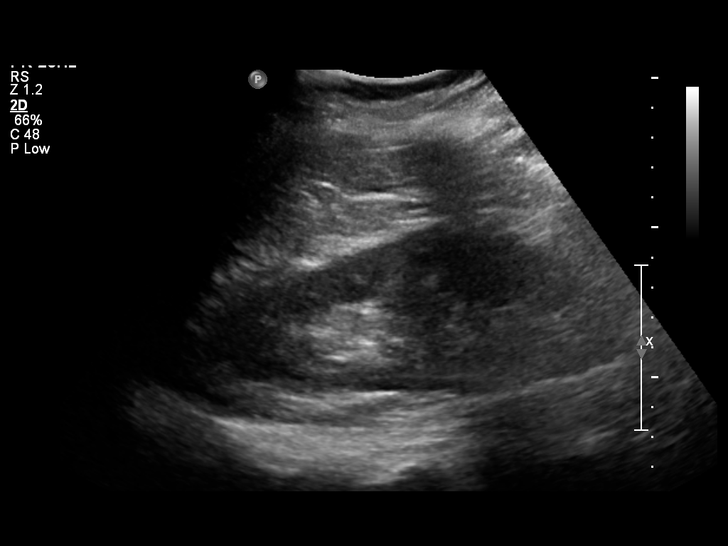
[im 76/76]
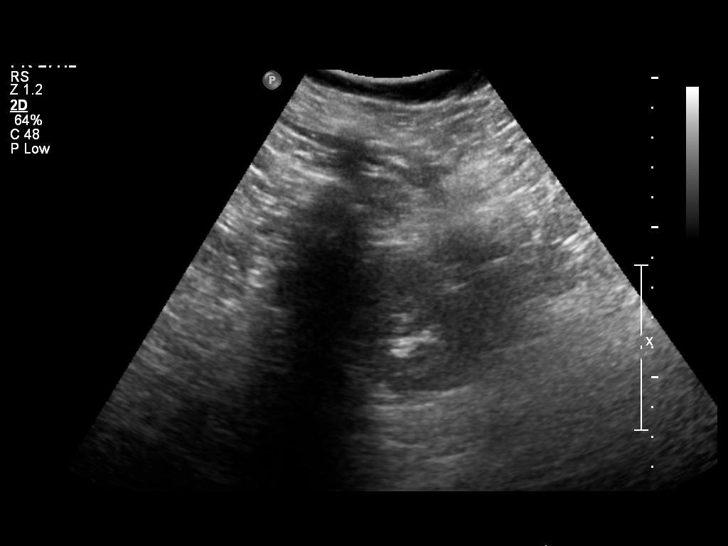

[14 of 25 positions shown; findings below may reference images not displayed]

FINDINGS: Gallbladder:  No gallstones, gallbladder wall thickening, or
pericholecystic fluid.  Negative sonographic Murphy's sign.

Common bile duct:  Poorly visualized, measuring 2 mm.

Liver:  Coarse, echogenic hepatic parenchyma, suggesting hepatic
steatosis.  No focal hepatic lesion is seen.

IVC:  Poorly visualized.

Pancreas:  Poorly visualized due to overlying bowel gas.

Spleen:  Measures 3.4 cm.

Right Kidney:  Measures 12.3 cm.  No mass or hydronephrosis.

Left Kidney:  Measures 13.2 cm.  No mass or hydronephrosis.

Abdominal aorta:  No aneurysm identified.
IMPRESSION: Suspected hepatic steatosis.

Otherwise negative abdominal ultrasound.

## 2013-09-13 ENCOUNTER — Ambulatory Visit: Payer: Medicaid Other | Admitting: Obstetrics and Gynecology

## 2013-09-18 DIAGNOSIS — K228 Other specified diseases of esophagus: Secondary | ICD-10-CM | POA: Insufficient documentation

## 2013-09-18 DIAGNOSIS — F17200 Nicotine dependence, unspecified, uncomplicated: Secondary | ICD-10-CM | POA: Insufficient documentation

## 2013-09-18 DIAGNOSIS — K7010 Alcoholic hepatitis without ascites: Secondary | ICD-10-CM | POA: Insufficient documentation

## 2013-09-18 DIAGNOSIS — J441 Chronic obstructive pulmonary disease with (acute) exacerbation: Secondary | ICD-10-CM | POA: Insufficient documentation

## 2013-09-18 DIAGNOSIS — K2289 Other specified disease of esophagus: Secondary | ICD-10-CM | POA: Insufficient documentation

## 2014-02-06 ENCOUNTER — Emergency Department (HOSPITAL_COMMUNITY)
Admission: EM | Admit: 2014-02-06 | Discharge: 2014-02-06 | Disposition: A | Discharge status: Home / Self Care | Payer: Medicaid Other | Attending: Emergency Medicine | Admitting: Emergency Medicine

## 2014-02-06 ENCOUNTER — Emergency Department (HOSPITAL_COMMUNITY): Payer: Medicaid Other

## 2014-02-06 ENCOUNTER — Encounter (HOSPITAL_COMMUNITY): Payer: Self-pay | Admitting: Emergency Medicine

## 2014-02-06 DIAGNOSIS — D259 Leiomyoma of uterus, unspecified: Secondary | ICD-10-CM | POA: Diagnosis not present

## 2014-02-06 DIAGNOSIS — J45909 Unspecified asthma, uncomplicated: Secondary | ICD-10-CM | POA: Insufficient documentation

## 2014-02-06 DIAGNOSIS — Z791 Long term (current) use of non-steroidal anti-inflammatories (NSAID): Secondary | ICD-10-CM | POA: Insufficient documentation

## 2014-02-06 DIAGNOSIS — F329 Major depressive disorder, single episode, unspecified: Secondary | ICD-10-CM | POA: Diagnosis not present

## 2014-02-06 DIAGNOSIS — I10 Essential (primary) hypertension: Secondary | ICD-10-CM | POA: Diagnosis not present

## 2014-02-06 DIAGNOSIS — F411 Generalized anxiety disorder: Secondary | ICD-10-CM | POA: Insufficient documentation

## 2014-02-06 DIAGNOSIS — F17200 Nicotine dependence, unspecified, uncomplicated: Secondary | ICD-10-CM | POA: Diagnosis not present

## 2014-02-06 DIAGNOSIS — Z79899 Other long term (current) drug therapy: Secondary | ICD-10-CM | POA: Insufficient documentation

## 2014-02-06 DIAGNOSIS — N939 Abnormal uterine and vaginal bleeding, unspecified: Secondary | ICD-10-CM

## 2014-02-06 DIAGNOSIS — N938 Other specified abnormal uterine and vaginal bleeding: Secondary | ICD-10-CM | POA: Insufficient documentation

## 2014-02-06 DIAGNOSIS — N925 Other specified irregular menstruation: Secondary | ICD-10-CM | POA: Diagnosis not present

## 2014-02-06 DIAGNOSIS — F3289 Other specified depressive episodes: Secondary | ICD-10-CM | POA: Insufficient documentation

## 2014-02-06 DIAGNOSIS — N949 Unspecified condition associated with female genital organs and menstrual cycle: Secondary | ICD-10-CM | POA: Diagnosis not present

## 2014-02-06 DIAGNOSIS — Z3202 Encounter for pregnancy test, result negative: Secondary | ICD-10-CM | POA: Diagnosis not present

## 2014-02-06 DIAGNOSIS — N898 Other specified noninflammatory disorders of vagina: Secondary | ICD-10-CM | POA: Diagnosis present

## 2014-02-06 LAB — CBC WITH DIFFERENTIAL/PLATELET
BASOS ABS: 0.1 10*3/uL (ref 0.0–0.1)
BASOS PCT: 1 % (ref 0–1)
Eosinophils Absolute: 0.2 10*3/uL (ref 0.0–0.7)
Eosinophils Relative: 2 % (ref 0–5)
HEMATOCRIT: 43.4 % (ref 36.0–46.0)
Hemoglobin: 14.7 g/dL (ref 12.0–15.0)
LYMPHS PCT: 26 % (ref 12–46)
Lymphs Abs: 2.8 10*3/uL (ref 0.7–4.0)
MCH: 29.1 pg (ref 26.0–34.0)
MCHC: 33.9 g/dL (ref 30.0–36.0)
MCV: 85.8 fL (ref 78.0–100.0)
Monocytes Absolute: 0.7 10*3/uL (ref 0.1–1.0)
Monocytes Relative: 7 % (ref 3–12)
NEUTROS ABS: 6.8 10*3/uL (ref 1.7–7.7)
NEUTROS PCT: 64 % (ref 43–77)
Platelets: 380 10*3/uL (ref 150–400)
RBC: 5.06 MIL/uL (ref 3.87–5.11)
RDW: 14.3 % (ref 11.5–15.5)
WBC: 10.5 10*3/uL (ref 4.0–10.5)

## 2014-02-06 LAB — WET PREP, GENITAL
CLUE CELLS WET PREP: NONE SEEN
TRICH WET PREP: NONE SEEN
YEAST WET PREP: NONE SEEN

## 2014-02-06 LAB — BASIC METABOLIC PANEL
Anion gap: 17 — ABNORMAL HIGH (ref 5–15)
BUN: 9 mg/dL (ref 6–23)
CO2: 20 mEq/L (ref 19–32)
Calcium: 9.5 mg/dL (ref 8.4–10.5)
Chloride: 100 mEq/L (ref 96–112)
Creatinine, Ser: 0.58 mg/dL (ref 0.50–1.10)
GFR calc Af Amer: >90 mL/min (ref >90)
GLUCOSE: 86 mg/dL (ref 70–99)
Potassium: 4.4 mEq/L (ref 3.7–5.3)
Sodium: 137 mEq/L (ref 137–147)

## 2014-02-06 LAB — URINALYSIS, ROUTINE W REFLEX MICROSCOPIC
Bilirubin Urine: NEGATIVE
Glucose, UA: NEGATIVE mg/dL
Ketones, ur: NEGATIVE mg/dL
NITRITE: NEGATIVE
Protein, ur: NEGATIVE mg/dL
SPECIFIC GRAVITY, URINE: 1.008 (ref 1.005–1.030)
Urobilinogen, UA: 0.2 mg/dL (ref 0.0–1.0)
pH: 5.5 (ref 5.0–8.0)

## 2014-02-06 LAB — URINE MICROSCOPIC-ADD ON

## 2014-02-06 LAB — PREGNANCY, URINE: Preg Test, Ur: NEGATIVE

## 2014-02-06 MED ORDER — OXYCODONE-ACETAMINOPHEN 5-325 MG PO TABS
2.0000 | ORAL_TABLET | Freq: Once | ORAL | Status: AC
  Administered 2014-02-06: 2 via ORAL
  Filled 2014-02-06: qty 2

## 2014-02-06 MED ORDER — LORAZEPAM 1 MG PO TABS
0.5000 mg | ORAL_TABLET | Freq: Once | ORAL | Status: AC
  Administered 2014-02-06: 0.5 mg via ORAL
  Filled 2014-02-06: qty 1

## 2014-02-06 MED ORDER — ONDANSETRON 4 MG PO TBDP
4.0000 mg | ORAL_TABLET | Freq: Once | ORAL | Status: AC
  Administered 2014-02-06: 4 mg via ORAL
  Filled 2014-02-06: qty 1

## 2014-02-06 MED ORDER — MEGESTROL ACETATE 20 MG PO TABS: 20.0000 mg | ORAL_TABLET | Freq: Two times a day (BID) | ORAL | Status: DC

## 2014-02-06 MED ORDER — ALBUTEROL SULFATE HFA 108 (90 BASE) MCG/ACT IN AERS: 2.0000 | INHALATION_SPRAY | Freq: Four times a day (QID) | RESPIRATORY_TRACT | Status: AC | PRN

## 2014-02-06 NOTE — ED Provider Notes (Signed)
CSN: [id number]     Arrival date & time 02/06/14  1127 History   First MD Initiated Contact with Patient 02/06/14 1139     Chief Complaint  Patient presents with  . Vaginal Bleeding     (Consider location/radiation/quality/duration/timing/severity/associated sxs/prior Treatment) HPI  A 48 yo female presents to the ED with complaints of vaginal bleeding x 2 months.  States the bleeding has become heavier within the past 2 weeks.  She has been changing her pads every hour.  She has noticed heavy clotting within the past two weeks.  Denies any abnormal bleeding prior to 2 months ago.  She had an injection of Depo-Provera on 01/28/14 to try to help bleeding.  Complaining of intermittent cramping pain in both the RLQ and LLQ.  She also feels some cramping pain midline in the suprapubic region with occasional sharp pain that she feels internally up through her vagina.  The vaginal pain lasts <1 minute and occurs 4-5 times per day.  Rates pain 10/10.  She took an oxycodone-acetaminophen 5-325 this morning for the pain.  Pt also has had some intermittent lightheadedness and dizziness during the past two weeks.  Typically resolves with rest.  Does not feel lightheaded presently.  States she "passed out" yesterday.  She was alone and believes it was for <1 minute.  No increased SOB from baseline.  Denies N/V, sweats, chills, and fever.      PMH includes depression, asthma, chronic mild SOB, panic attacks, anxiety, and hypertension.  Pt also has a history of a uterine fibroid.  It was last examined by TUS in February 2014 and measured to be 7.2x7.6x7.6 cm.      Past Medical History  Diagnosis Date  . Depression   . Asthma   . Hypertension   . Fibroids   . OSA (obstructive sleep apnea)   . Panic attacks   . Anxiety    Past Surgical History  Procedure Laterality Date  . Dilation and curettage of uterus     Family History  Problem Relation Age of Onset  . Lung cancer Mother   . Cancer Father     History  Substance Use Topics  . Smoking status: Current Every Day Smoker -- 2.00 packs/day for 27 years    Types: Cigarettes  . Smokeless tobacco: Never Used  . Alcohol Use: No     Comment: VODKA 3 shots alcohol today, 04 Feb 2013   OB History   Grav Para Term Preterm Abortions TAB SAB Ect Mult Living   6 3 3  0 3  3 0 0 3     Review of Systems  Review of Systems  Gen: no weight loss, fevers, chills, night sweats  Eyes: no discharge or drainage, no occular pain or visual changes  Nose: no epistaxis or rhinorrhea  Mouth: no dental pain, no sore throat  Neck: no neck pain  Lungs:No wheezing, coughing or hemoptysis CV: no chest pain, palpitations, dependent edema or orthopnea  Abd: no abdominal pain, nausea, vomiting, diarrhea GU: no dysuria or gross hematuria + vaginal bleeding and uterine pain MSK:  No muscle weakness or pain Neuro: no headache, no focal neurologic deficits  Skin: no rash or wounds Psyche: no complaints    Allergies  Review of patient's allergies indicates no known allergies.  Home Medications   Prior to Admission medications   Medication Sig Start Date End Date Taking? Authorizing Provider  LORazepam (ATIVAN) 1 MG tablet Take 1 mg by mouth 3 (three) times  daily as needed. 01/28/14  Yes Historical Provider, MD  medroxyPROGESTERone (DEPO-PROVERA) 150 MG/ML injection Inject 150 mg into the muscle once.   Yes Historical Provider, MD  naproxen sodium (ANAPROX) 220 MG tablet Take 440 mg by mouth 2 (two) times daily with a meal.   Yes Historical Provider, MD  oxyCODONE-acetaminophen (ROXICET) 5-325 MG per tablet Take 1 tablet by mouth every 6 (six) hours as needed for moderate pain.  01/28/14 01/28/15 Yes Historical Provider, MD  zolpidem (AMBIEN) 5 MG tablet Take 5 mg by mouth at bedtime as needed.   Yes Historical Provider, MD  albuterol (PROAIR HFA) 108 (90 BASE) MCG/ACT inhaler Inhale 2 puffs into the lungs every 6 (six) hours as needed for wheezing or  shortness of breath. 02/06/14   Matalie Romberger Marilu Favre, PA-C  megestrol (MEGACE) 20 MG tablet Take 1 tablet (20 mg total) by mouth 2 (two) times daily. 02/06/14   Rakiyah Esch Marilu Favre, PA-C   BP 115/56  Pulse 83  Temp(Src) 98.2 F (36.8 C) (Oral)  Resp 16  Ht 5\' 5"  (1.651 m)  Wt 150 lb (68.04 kg)  BMI 24.96 kg/m2  SpO2 98% Physical Exam  Nursing note and vitals reviewed. Constitutional: She appears well-developed and well-nourished. No distress.  HENT:  Head: Normocephalic and atraumatic.  Eyes: Pupils are equal, round, and reactive to light.  Neck: Normal range of motion. Neck supple.  Cardiovascular: Normal rate and regular rhythm.   Pulmonary/Chest: Effort normal.  Abdominal: Soft.  Genitourinary: Uterus is enlarged and tender. Cervix exhibits no motion tenderness, no discharge and no friability. Left adnexum displays mass and tenderness. There is tenderness and bleeding around the vagina.  Neurological: She is alert.  Skin: Skin is warm and dry.    General assessment:  woman with heavy makeup who appears in NAD. HEENT:  Normocephalic, atraumatic.  Temporal pulses strong. Chest:  CTA bilaterally posteriorly and anteriorly. Heart:  RRR.  No m/g/r. Abdomen:  Moderate tenderness to palpation in RLQ and LLQ.  Firm 5cm in width mass felt in midline suprapubic region.   Extremities:  Distal pulses strong in all four extremities.   Psych:  Alert and oriented x 3.  Affect:  appropriate.           ED Course  Procedures (including critical care time) Labs Review Labs Reviewed  WET PREP, GENITAL - Abnormal; Notable for the following:    WBC, Wet Prep HPF POC FEW (*)    All other components within normal limits  BASIC METABOLIC PANEL - Abnormal; Notable for the following:    Anion gap 17 (*)    All other components within normal limits  URINALYSIS, ROUTINE W REFLEX MICROSCOPIC - Abnormal; Notable for the following:    Color, Urine RED (*)    APPearance HAZY (*)    Hgb urine dipstick LARGE  (*)    Leukocytes, UA SMALL (*)    All other components within normal limits  URINE MICROSCOPIC-ADD ON - Abnormal; Notable for the following:    Squamous Epithelial / LPF FEW (*)    Bacteria, UA FEW (*)    All other components within normal limits  GC/CHLAMYDIA PROBE AMP  CBC WITH DIFFERENTIAL  PREGNANCY, URINE    Imaging Review US Transvaginal Non-ob  02/06/2014   CLINICAL DATA:  Abnormal uterine bleeding.  Known fibroid.  EXAM: TRANSVAGINAL ULTRASOUND OF PELVIS; US PELVIS COMPLETE  TECHNIQUE: Transvaginal ultrasound examination of the pelvis was performed including evaluation of the uterus, ovaries, adnexal regions, and pelvic  cul-de-sac.  COMPARISON:  Ultrasound dated 09/05/2012  FINDINGS: Uterus  Measurements: 15.7 x 8.0 x 9.8 cm. There is a large fundal fibroid measuring 9.7 x 8.5 x 10.6 cm.  Endometrium  Thickness: The endometrial cavity is distorted. It has a maximum thickness of 17 mm.  Right ovary  Measurements: 3.7 x 1.8 x 1.9 cm. Normal appearance/no adnexal mass.  Left ovary  Not visualized.  Other findings:  No free fluid.  IMPRESSION: Increased size of the large fundal fibroid since the prior study. The endometrium is prominent at 17 mm.   Electronically Signed   By: Rozetta Nunnery M.D.   On: 02/06/2014 14:01   US Pelvis Complete  02/06/2014   CLINICAL DATA:  Abnormal uterine bleeding.  Known fibroid.  EXAM: TRANSVAGINAL ULTRASOUND OF PELVIS; US PELVIS COMPLETE  TECHNIQUE: Transvaginal ultrasound examination of the pelvis was performed including evaluation of the uterus, ovaries, adnexal regions, and pelvic cul-de-sac.  COMPARISON:  Ultrasound dated 09/05/2012  FINDINGS: Uterus  Measurements: 15.7 x 8.0 x 9.8 cm. There is a large fundal fibroid measuring 9.7 x 8.5 x 10.6 cm.  Endometrium  Thickness: The endometrial cavity is distorted. It has a maximum thickness of 17 mm.  Right ovary  Measurements: 3.7 x 1.8 x 1.9 cm. Normal appearance/no adnexal mass.  Left ovary  Not visualized.   Other findings:  No free fluid.  IMPRESSION: Increased size of the large fundal fibroid since the prior study. The endometrium is prominent at 17 mm.   Electronically Signed   By: Rozetta Nunnery M.D.   On: 02/06/2014 14:01     EKG Interpretation None      MDM   Final diagnoses:  Uterine leiomyoma, unspecified location  Abnormal vaginal bleeding    Patients bleeding is mild but her pain is severe. I discussed all of her results and case with the OB/Gyn physician as Lutricia Feil who recommends Megace, she has informed me that the patient will start bleeding again when medication is completed if she does not see gynecologist soon. She needs further treatment but has been non compliant with follow up. Referral given.  albuterol (PROAIR HFA) 108 (90 BASE) MCG/ACT inhaler Inhale 2 puffs into the lungs every 6 (six) hours as needed for wheezing or shortness of breath. 1 Inhaler Hadja Harral Marilu Favre, PA-C   megestrol (MEGACE) 20 MG tablet Take 1 tablet (20 mg total) by mouth 2 (two) times daily. 40 tablet Linus Mako, PA-C       48 [facility] in the Emergency Department is complete. It has been determined that no acute conditions requiring further emergency intervention are present at this time. The patient/guardian have been advised of the diagnosis and plan. We have discussed signs and symptoms that warrant return to the ED, such as changes or worsening in symptoms.  Vital signs are stable at discharge. Filed Vitals:   02/06/14 1630  BP: 115/56  Pulse: 83  Temp:   Resp: 16    Patient/guardian has voiced understanding and agreed to follow-up with the PCP or specialist.     Linus Mako, PA-C 02/07/14 2004

## 2014-02-06 NOTE — ED Notes (Signed)
Patient transported to Ultrasound 

## 2014-02-06 NOTE — ED Notes (Addendum)
Pt reports soaking pad every 2 hours; feeling lightheaded and dizzy. States they wont remove fibroid she has had for several years. Was given depo shot 3 weeks ago to try to control bleeding. States she has fainted a few times recently.

## 2014-02-06 NOTE — Discharge Instructions (Signed)
Abnormal Uterine Bleeding °Abnormal uterine bleeding can affect women at various stages in life, including teenagers, women in their reproductive years, pregnant women, and women who have reached menopause. Several kinds of uterine bleeding are considered abnormal, including: °· Bleeding or spotting between periods.   °· Bleeding after sexual intercourse.   °· Bleeding that is heavier or more than normal.   °· Periods that last longer than usual. °· Bleeding after menopause.   °Many cases of abnormal uterine bleeding are minor and simple to treat, while others are more serious. Any type of abnormal bleeding should be evaluated by your health care provider. Treatment will depend on the cause of the bleeding. °HOME CARE INSTRUCTIONS °Monitor your condition for any changes. The following actions may help to alleviate any discomfort you are experiencing: °· Avoid the use of tampons and douches as directed by your health care provider. °· Change your pads frequently. °You should get regular pelvic exams and Pap tests. Keep all follow-up appointments for diagnostic tests as directed by your health care provider.  °SEEK MEDICAL CARE IF:  °· Your bleeding lasts more than 1 week.   °· You feel dizzy at times.   °SEEK IMMEDIATE MEDICAL CARE IF:  °· You pass out.   °· You are changing pads every 15 to 30 minutes.   °· You have abdominal pain. °· You have a fever.   °· You become sweaty or weak.   °· You are passing large blood clots from the vagina.   °· You start to feel nauseous and vomit. °MAKE SURE YOU:  °· Understand these instructions. °· Will watch your condition. °· Will get help right away if you are not doing well or get worse. °Document Released: 07/12/2005 Document Revised: 07/17/2013 Document Reviewed: 02/08/2013 °ExitCare® Patient Information ©2015 [employer]. This information is not intended to replace advice given to you by your health care provider. Make sure you discuss any questions you have with your  health care provider. °Uterine Fibroid °A uterine fibroid is a growth (tumor) that occurs in your uterus. This type of tumor is not cancerous and does not spread out of the uterus. You can have one or many fibroids. Fibroids can vary in size, weight, and where they grow in the uterus. Some can become quite large. Most fibroids do not require medical treatment, but some can cause pain or heavy bleeding during and between periods. °CAUSES  °A fibroid is the result of a single uterine cell that keeps growing (unregulated), which is different than most cells in the human body. Most cells have a control mechanism that keeps them from reproducing without control.  °SIGNS AND SYMPTOMS  °· Bleeding. °· Pelvic pain and pressure. °· Bladder problems due to the size of the fibroid. °· Infertility and miscarriages depending on the size and location of the fibroid. °DIAGNOSIS  °Uterine fibroids are diagnosed through a physical exam. Your health care provider may feel the lumpy tumors during a pelvic exam. Ultrasonography may be done to get information regarding size, location, and number of tumors.  °TREATMENT  °· Your health care provider may recommend watchful waiting. This involves getting the fibroid checked by your health care provider to see if it grows or shrinks.   °· Hormone treatment or an intrauterine device (IUD) may be prescribed.   °· Surgery may be needed to remove the fibroids (myomectomy) or the uterus (hysterectomy). This depends on your situation. °When fibroids interfere with fertility and a woman wants to become pregnant, a health care provider may recommend having the fibroids removed.  °HOME   CARE INSTRUCTIONS  °Home care depends on how you were treated. In general:  °· Keep all follow-up appointments with your health care provider.   °· Only take over-the-counter or prescription medicines as directed by your health care provider. If you were prescribed a hormone treatment, take the hormone medicines  exactly as directed. Do not take aspirin. It can cause bleeding.   °· Talk to your health care provider about taking iron pills. °· If your periods are troublesome but not so heavy, lie down with your feet raised slightly above your heart. Place cold packs on your lower abdomen.   °· If your periods are heavy, write down the number of pads or tampons you use per month. Bring this information to your health care provider.   °· Include green vegetables in your diet.   °SEEK IMMEDIATE MEDICAL CARE IF: °· You have pelvic pain or cramps not controlled with medicines.   °· You have a sudden increase in pelvic pain.   °· You have an increase in bleeding between and during periods.   °· You have excessive periods and soak tampons or pads in a half hour or less. °· You feel lightheaded or have fainting episodes. °Document Released: 07/09/2000 Document Revised: 05/02/2013 Document Reviewed: 02/08/2013 °ExitCare® Patient Information ©2015 [employer]. This information is not intended to replace advice given to you by your health care provider. Make sure you discuss any questions you have with your health care provider. ° °

## 2014-02-06 NOTE — ED Notes (Signed)
Vaginal bleeding for 2 months. Last 14 days have been very heavy. Received DEPO shot 15 days ago to try to control her vaginal bleeding.

## 2014-02-07 LAB — GC/CHLAMYDIA PROBE AMP
CT Probe RNA: NEGATIVE
GC PROBE AMP APTIMA: NEGATIVE

## 2014-02-12 NOTE — ED Provider Notes (Signed)
Medical screening examination/treatment/procedure(s) were performed by non-physician practitioner and as supervising physician I was immediately available for consultation/collaboration.   EKG Interpretation None      Rolland Porter, MD, Abram Sander   Janice Norrie, MD 02/12/14 [phone]

## 2014-02-13 ENCOUNTER — Inpatient Hospital Stay (HOSPITAL_COMMUNITY)
Admission: AD | Admit: 2014-02-13 | Discharge: 2014-02-13 | Disposition: A | Discharge status: Home / Self Care | Payer: Medicaid Other | Source: Ambulatory Visit | Attending: Obstetrics & Gynecology | Admitting: Obstetrics & Gynecology

## 2014-02-13 ENCOUNTER — Encounter (HOSPITAL_COMMUNITY): Payer: Self-pay | Admitting: *Deleted

## 2014-02-13 DIAGNOSIS — D259 Leiomyoma of uterus, unspecified: Secondary | ICD-10-CM | POA: Insufficient documentation

## 2014-02-13 DIAGNOSIS — F1020 Alcohol dependence, uncomplicated: Secondary | ICD-10-CM | POA: Insufficient documentation

## 2014-02-13 DIAGNOSIS — N938 Other specified abnormal uterine and vaginal bleeding: Secondary | ICD-10-CM | POA: Diagnosis present

## 2014-02-13 DIAGNOSIS — I10 Essential (primary) hypertension: Secondary | ICD-10-CM | POA: Insufficient documentation

## 2014-02-13 DIAGNOSIS — G4733 Obstructive sleep apnea (adult) (pediatric): Secondary | ICD-10-CM | POA: Diagnosis not present

## 2014-02-13 DIAGNOSIS — N949 Unspecified condition associated with female genital organs and menstrual cycle: Secondary | ICD-10-CM | POA: Insufficient documentation

## 2014-02-13 DIAGNOSIS — F17200 Nicotine dependence, unspecified, uncomplicated: Secondary | ICD-10-CM | POA: Diagnosis not present

## 2014-02-13 DIAGNOSIS — R109 Unspecified abdominal pain: Secondary | ICD-10-CM | POA: Insufficient documentation

## 2014-02-13 HISTORY — DX: Encounter for other specified aftercare: Z51.89

## 2014-02-13 LAB — URINALYSIS, ROUTINE W REFLEX MICROSCOPIC
Bilirubin Urine: NEGATIVE
Glucose, UA: NEGATIVE mg/dL
Ketones, ur: NEGATIVE mg/dL
NITRITE: NEGATIVE
PROTEIN: NEGATIVE mg/dL
Specific Gravity, Urine: 1.015 (ref 1.005–1.030)
UROBILINOGEN UA: 0.2 mg/dL (ref 0.0–1.0)
pH: 6 (ref 5.0–8.0)

## 2014-02-13 LAB — RAPID URINE DRUG SCREEN, HOSP PERFORMED
AMPHETAMINES: NOT DETECTED
Barbiturates: NOT DETECTED
Benzodiazepines: NOT DETECTED
COCAINE: NOT DETECTED
OPIATES: NOT DETECTED
TETRAHYDROCANNABINOL: NOT DETECTED

## 2014-02-13 LAB — URINE MICROSCOPIC-ADD ON

## 2014-02-13 LAB — CBC
HEMATOCRIT: 41.7 % (ref 36.0–46.0)
HEMOGLOBIN: 14.4 g/dL (ref 12.0–15.0)
MCH: 29.7 pg (ref 26.0–34.0)
MCHC: 34.5 g/dL (ref 30.0–36.0)
MCV: 86 fL (ref 78.0–100.0)
Platelets: 372 10*3/uL (ref 150–400)
RBC: 4.85 MIL/uL (ref 3.87–5.11)
RDW: 14.4 % (ref 11.5–15.5)
WBC: 12 10*3/uL — ABNORMAL HIGH (ref 4.0–10.5)

## 2014-02-13 LAB — POCT PREGNANCY, URINE: Preg Test, Ur: NEGATIVE

## 2014-02-13 MED ORDER — KETOROLAC TROMETHAMINE 60 MG/2ML IM SOLN
60.0000 mg | Freq: Once | INTRAMUSCULAR | Status: AC
  Administered 2014-02-13: 60 mg via INTRAMUSCULAR
  Filled 2014-02-13: qty 2

## 2014-02-13 NOTE — Discharge Instructions (Signed)
Fibroids Fibroids are lumps (tumors) that can occur any place in a woman's body. These lumps are not cancerous. Fibroids vary in size, weight, and where they grow. HOME CARE  Do not take aspirin.  Write down the number of pads or tampons you use during your period. Tell your doctor. This can help determine the best treatment for you. GET HELP RIGHT AWAY IF:  You have pain in your lower belly (abdomen) that is not helped with medicine.  You have cramps that are not helped with medicine.  You have more bleeding between or during your period.  You feel lightheaded or pass out (faint).  Your lower belly pain gets worse. MAKE SURE YOU:  Understand these instructions.  Will watch your condition.  Will get help right away if you are not doing well or get worse. Document Released: 08/14/2010 Document Revised: 10/04/2011 Document Reviewed: 08/14/2010 ExitCare Patient Information 2015 [employer]. This information is not intended to replace advice given to you by your health care provider. Make sure you discuss any questions you have with your health care provider.  

## 2014-02-13 NOTE — MAU Provider Note (Signed)
History     CSN: [id number]  Arrival date and time: 02/13/14 1507   First Provider Initiated Contact with Patient 02/13/14 1839      Chief Complaint  Patient presents with  . Vaginal Bleeding  . Abdominal Pain   HPI Comments: Leslie [name] 48 y.o. [id number] presents to MAU with vaginal bleeding. She has a known fibroid that was enlarged on her ultrasound on 02/06/14. She is on megace 40 mg daily. Her H/H was stable at 14.7 and 43.4 on July 15. She is a patient at [facility] and has an appointment at [facility] office on 03/05/14. They are trying to work her in sooner.  Pt is known alcoholic.  Vaginal Bleeding Associated symptoms include abdominal pain.  Abdominal Pain      Past Medical History  Diagnosis Date  . Fibroids   . OSA (obstructive sleep apnea)   . Panic attacks   . Anxiety   . Blood transfusion without reported diagnosis     with miscarriage  . Hypertension     denies  . Asthma     uses inhaler, but not asthma  . Depression     denies    Past Surgical History  Procedure Laterality Date  . Dilation and curettage of uterus      Family History  Problem Relation Age of Onset  . Lung cancer Mother   . Hypertension Mother   . Cancer Mother     lung  . Hypertension Father   . Heart disease Father     History  Substance Use Topics  . Smoking status: Current Every Day Smoker -- 1.00 packs/day for 15 years    Types: Cigarettes  . Smokeless tobacco: Never Used  . Alcohol Use: No    Allergies: No Known Allergies  Prescriptions prior to admission  Medication Sig Dispense Refill  . albuterol (PROAIR HFA) 108 (90 BASE) MCG/ACT inhaler Inhale 2 puffs into the lungs every 6 (six) hours as needed for wheezing or shortness of breath.  1 Inhaler  0  . LORazepam (ATIVAN) 1 MG tablet Take 1 mg by mouth 3 (three) times daily as needed.      . medroxyPROGESTERone (DEPO-PROVERA) 150 MG/ML injection Inject 150 mg into the muscle once.      . megestrol  (MEGACE) 20 MG tablet Take 1 tablet (20 mg total) by mouth 2 (two) times daily.  40 tablet  0  . naproxen sodium (ANAPROX) 220 MG tablet Take 440 mg by mouth 2 (two) times daily with a meal.      . oxyCODONE-acetaminophen (ROXICET) 5-325 MG per tablet Take 1 tablet by mouth every 6 (six) hours as needed for moderate pain.       Marland Kitchen zolpidem (AMBIEN) 5 MG tablet Take 5 mg by mouth at bedtime as needed.        Review of Systems  Constitutional: Negative.   HENT: Negative.   Eyes: Negative.   Respiratory: Negative.   Cardiovascular: Negative.   Gastrointestinal: Positive for abdominal pain.  Genitourinary: Positive for vaginal bleeding.       Vaginal bleeding  Musculoskeletal: Negative.   Skin: Negative.   Neurological: Negative.   Psychiatric/Behavioral: Negative.    Physical Exam   Blood pressure 119/70, pulse 85, temperature 99.2 F (37.3 C), temperature source Oral, resp. rate 16, height 5\' 2"  (1.575 m), weight 69.31 kg (152 lb 12.8 oz), SpO2 99.00%.  Physical Exam  Constitutional: She is oriented to person, place, and time. She appears well-developed  and well-nourished. No distress.  HENT:  Head: Normocephalic and atraumatic.  GI: Soft. Bowel sounds are normal. She exhibits no distension. There is no tenderness. There is no rebound and no guarding.  Genitourinary:  Genital:External negative Vaginal:small amount bleeding Cervix:closed/ thick Bimanual:fibroid tenderness with exam   Musculoskeletal: Normal range of motion.  Neurological: She is alert and oriented to person, place, and time.  Skin: Skin is warm.  Psychiatric: She has a normal mood and affect. Her behavior is normal. Judgment and thought content normal.   Results for orders placed during the hospital encounter of 02/13/14 (from the past 24 hour(s))  URINALYSIS, ROUTINE W REFLEX MICROSCOPIC     Status: Abnormal   Collection Time    02/13/14  3:50 PM      Result Value Ref Range   Color, Urine YELLOW  YELLOW    APPearance CLEAR  CLEAR   Specific Gravity, Urine 1.015  1.005 - 1.030   pH 6.0  5.0 - 8.0   Glucose, UA NEGATIVE  NEGATIVE mg/dL   Hgb urine dipstick LARGE (*) NEGATIVE   Bilirubin Urine NEGATIVE  NEGATIVE   Ketones, ur NEGATIVE  NEGATIVE mg/dL   Protein, ur NEGATIVE  NEGATIVE mg/dL   Urobilinogen, UA 0.2  0.0 - 1.0 mg/dL   Nitrite NEGATIVE  NEGATIVE   Leukocytes, UA SMALL (*) NEGATIVE  URINE MICROSCOPIC-ADD ON     Status: Abnormal   Collection Time    02/13/14  3:50 PM      Result Value Ref Range   Squamous Epithelial / LPF FEW (*) RARE   WBC, UA 3-6  <3 WBC/hpf   RBC / HPF 3-6  <3 RBC/hpf   Bacteria, UA FEW (*) RARE   Urine-Other MUCOUS PRESENT    POCT PREGNANCY, URINE     Status: None   Collection Time    02/13/14  4:09 PM      Result Value Ref Range   Preg Test, Ur NEGATIVE  NEGATIVE  CBC     Status: Abnormal   Collection Time    02/13/14  7:26 PM      Result Value Ref Range   WBC 12.0 (*) 4.0 - 10.5 K/uL   RBC 4.85  3.87 - 5.11 MIL/uL   Hemoglobin 14.4  12.0 - 15.0 g/dL   HCT 41.7  36.0 - 46.0 %   MCV 86.0  78.0 - 100.0 fL   MCH 29.7  26.0 - 34.0 pg   MCHC 34.5  30.0 - 36.0 g/dL   RDW 14.4  11.5 - 15.5 %   Platelets 372  150 - 400 K/uL     MAU Course  Procedures  MDM CBC UDS Toradol 60 mg IM Assessment and Plan  A: Uterine Fibroid  P: Advised to stay on Megace daily Advised she is stable now Will send message to [facility] to get in asap Return to MAU as needed  Georgia Duff 02/13/2014, 7:12 PM

## 2014-02-13 NOTE — MAU Note (Signed)
Patient states she has a know fibroid and was seen at [facility] ED and instructed to come to MAU if bleeding or pain did not get better. States she has had bleeding daily for over 2 months. Has had Depo and been on BCP's and continues to have bleeding. Has had bad cramping off and on.No pain at this time.

## 2014-02-13 NOTE — MAU Note (Signed)
Hx of fibroid, went to [facility] recently because of bleeding.  Bleeding daily for over 2 months.  Starting to feel weak and is sleeping more.

## 2014-03-05 ENCOUNTER — Encounter: Payer: Self-pay | Admitting: Obstetrics & Gynecology

## 2014-03-05 ENCOUNTER — Ambulatory Visit (INDEPENDENT_AMBULATORY_CARE_PROVIDER_SITE_OTHER): Payer: Medicaid Other | Admitting: Obstetrics & Gynecology

## 2014-03-05 VITALS — BP 144/92 | HR 94 | Resp 16 | Ht 65.0 in | Wt 155.0 lb

## 2014-03-05 DIAGNOSIS — Z01812 Encounter for preprocedural laboratory examination: Secondary | ICD-10-CM

## 2014-03-05 DIAGNOSIS — N920 Excessive and frequent menstruation with regular cycle: Secondary | ICD-10-CM

## 2014-03-05 LAB — POCT URINE PREGNANCY: Preg Test, Ur: NEGATIVE

## 2014-03-05 NOTE — Progress Notes (Addendum)
   Subjective:    Patient ID: Leslie [name], female    DOB: [birth year], 48 y.o.   MRN: [id number]  HPI  Pt presents for evaluation of metrorrhagia and uterine fibroid.  Pt has had know uterine fibroid.  Based on [place] in [place], the fiborid has grown 2 cm in size iv the past 16 months.  Pt had a CT in the [place] system which showed the fibroid to be the same size as [place] in July 2015.  Pt has bled or spottde for 3-4 months.  Pt has hot moments, but not typical hot flashes.  Pt does not want hysterectomy.  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  Pt states she is clean since Feb 2015.  Review of Systems  Constitutional: Negative.   Respiratory: Negative.   Cardiovascular: Negative.   Gastrointestinal: Positive for abdominal pain.  Genitourinary: Positive for menstrual problem and pelvic pain. Negative for vaginal discharge.      Objective:   Physical Exam  Vitals reviewed. Constitutional: She is oriented to person, place, and time. She appears well-developed and well-nourished. No distress.  HENT:  Head: Normocephalic and atraumatic.  Eyes: Conjunctivae are normal.  Pulmonary/Chest: Effort normal.  Abdominal: Soft.  Uterine fundus can be felt mid abdomen.  Mildly tender.   Neurological: She is alert and oriented to person, place, and time.  Skin: Skin is warm and dry.  Psychiatric: She has a normal mood and affect.          Assessment & Plan:  48 yo [phone] with one approx 10 cm fibroid (not grown since Feb 2015 when compared to [facility] scan via care everywhere) and metrorrhagia.    1-endometrial biopsy today 2-No estrogen (smoker) 3-check LFTs and coags 4-HGB stable at last ED visit. 5-Will call with results and develop plan 6-narcotic pain meds to be avoided due to recent alcoholism.  ENDOMETRIAL BIOPSY     The indications for endometrial biopsy were reviewed.   Risks of the biopsy including cramping, bleeding,  infection, uterine perforation, inadequate specimen and need for additional procedures  were discussed. The patient states she understands and agrees to undergo procedure today. Consent was signed. Time out was performed. Urine HCG was negative. A sterile speculum was placed in the patient's vagina and the cervix was prepped with Betadine. A single-toothed tenaculum was placed on the anterior lip of the cervix to stabilize it. The 3 mm pipelle was introduced into the endometrial cavity without difficulty to a depth of 8-9cm, and a moderate amount of tissue was obtained and sent to pathology. The instruments were removed from the patient's vagina. Minimal bleeding from the cervix was noted. The patient tolerated the procedure well. Routine post-procedure instructions were given to the patient. The patient will follow up to review the results and for further management.    Addendum: Endometrial biopsy is negative.  LFTs and coags are negative.  Pt will contact us with further bleeding or issues.

## 2014-03-06 LAB — COMPREHENSIVE METABOLIC PANEL
ALBUMIN: 4.4 g/dL (ref 3.5–5.2)
ALK PHOS: 64 U/L (ref 39–117)
ALT: 27 U/L (ref 0–35)
AST: 16 U/L (ref 0–37)
BUN: 11 mg/dL (ref 6–23)
CO2: 24 mEq/L (ref 19–32)
Calcium: 9.7 mg/dL (ref 8.4–10.5)
Chloride: 104 mEq/L (ref 96–112)
Creat: 0.64 mg/dL (ref 0.50–1.10)
Glucose, Bld: 82 mg/dL (ref 70–99)
POTASSIUM: 4.3 meq/L (ref 3.5–5.3)
Sodium: 137 mEq/L (ref 135–145)
TOTAL PROTEIN: 7.2 g/dL (ref 6.0–8.3)
Total Bilirubin: 0.3 mg/dL (ref 0.2–1.2)

## 2014-03-06 LAB — APTT: APTT: 30 s (ref 24–37)

## 2014-03-06 LAB — PROTIME-INR
INR: 0.97 (ref <1.50)
Prothrombin Time: 12.9 seconds (ref 11.6–15.2)

## 2014-03-14 ENCOUNTER — Ambulatory Visit (INDEPENDENT_AMBULATORY_CARE_PROVIDER_SITE_OTHER): Payer: Medicaid Other | Admitting: *Deleted

## 2014-03-14 DIAGNOSIS — D259 Leiomyoma of uterus, unspecified: Secondary | ICD-10-CM

## 2014-03-14 MED ORDER — LEUPROLIDE ACETATE (3 MONTH) 11.25 MG IM KIT
11.2500 mg | PACK | Freq: Once | INTRAMUSCULAR | Status: AC
  Administered 2014-03-14: 11.25 mg via INTRAMUSCULAR

## 2014-03-22 ENCOUNTER — Encounter: Payer: Self-pay | Admitting: Obstetrics & Gynecology

## 2014-04-04 ENCOUNTER — Ambulatory Visit: Payer: Self-pay | Admitting: Obstetrics & Gynecology

## 2014-04-04 ENCOUNTER — Telehealth: Payer: Self-pay | Admitting: *Deleted

## 2014-04-04 NOTE — Telephone Encounter (Signed)
Pt called in stating she was not able to come to her appt for today but would like pain medicine called in to her pharmacy. She explained that her bleeding has almost stopped but still having pain. I stated to the patient that she was evaluated by Dr. Gala Romney at her last visit and she was scheduled to see Dr. Hulan Fray today. Since Dr. Hulan Fray has never seen this patient I explained to her that she would need to come to her appt if she is was still having pain. Also, we can't call narcotics into pharm. Pt expressed understanding.  Pt states her PCP gave her pain meds to hold her over until gyn eval but has run out. I offered to reschedule but pt wants to call back to set another appt.

## 2014-04-17 ENCOUNTER — Encounter: Payer: Self-pay | Admitting: Internal Medicine

## 2014-05-27 ENCOUNTER — Encounter: Payer: Self-pay | Admitting: Obstetrics & Gynecology

## 2014-09-02 ENCOUNTER — Emergency Department (INDEPENDENT_AMBULATORY_CARE_PROVIDER_SITE_OTHER): Payer: Medicaid Other

## 2014-09-02 ENCOUNTER — Emergency Department (HOSPITAL_COMMUNITY)
Admission: EM | Admit: 2014-09-02 | Discharge: 2014-09-02 | Disposition: A | Discharge status: Home / Self Care | Payer: Medicaid Other | Source: Home / Self Care | Attending: Family Medicine | Admitting: Family Medicine

## 2014-09-02 ENCOUNTER — Encounter (HOSPITAL_COMMUNITY): Payer: Self-pay | Admitting: Emergency Medicine

## 2014-09-02 DIAGNOSIS — M79671 Pain in right foot: Secondary | ICD-10-CM

## 2014-09-02 MED ORDER — IBUPROFEN 800 MG PO TABS: 800.0000 mg | ORAL_TABLET | Freq: Three times a day (TID) | ORAL | Status: DC

## 2014-09-02 MED ORDER — TRAMADOL HCL 50 MG PO TABS: 50.0000 mg | ORAL_TABLET | Freq: Four times a day (QID) | ORAL | Status: DC | PRN

## 2014-09-02 NOTE — Discharge Instructions (Signed)
Pain of Unknown Etiology (Pain Without a Known Cause) °You have come to your caregiver because of pain. Pain can occur in any part of the body. Often there is not a definite cause. If your laboratory (blood or urine) work was normal and X-rays or other studies were normal, your caregiver may treat you without knowing the cause of the pain. An example of this is the headache. Most headaches are diagnosed by taking a history. This means your caregiver asks you questions about your headaches. Your caregiver determines a treatment based on your answers. Usually testing done for headaches is normal. Often testing is not done unless there is no response to medications. Regardless of where your pain is located today, you can be given medications to make you comfortable. If no physical cause of pain can be found, most cases of pain will gradually leave as suddenly as they came.  °If you have a painful condition and no reason can be found for the pain, it is important that you follow up with your caregiver. If the pain becomes worse or does not go away, it may be necessary to repeat tests and look further for a possible cause. °· Only take over-the-counter or prescription medicines for pain, discomfort, or fever as directed by your caregiver. °· For the protection of your privacy, test results cannot be given over the phone. Make sure you receive the results of your test. Ask how these results are to be obtained if you have not been informed. It is your responsibility to obtain your test results. °· You may continue all activities unless the activities cause more pain. When the pain lessens, it is important to gradually resume normal activities. Resume activities by beginning slowly and gradually increasing the intensity and duration of the activities or exercise. During periods of severe pain, bed rest may be helpful. Lie or sit in any position that is comfortable. °· Ice used for acute (sudden) conditions may be effective.  Use a large plastic bag filled with ice and wrapped in a towel. This may provide pain relief. °· See your caregiver for continued problems. Your caregiver can help or refer you for exercises or physical therapy if necessary. °If you were given medications for your condition, do not drive, operate machinery or power tools, or sign legal documents for 24 hours. Do not drink alcohol, take sleeping pills, or take other medications that may interfere with treatment. °See your caregiver immediately if you have pain that is becoming worse and not relieved by medications. °Document Released: 04/06/2001 Document Revised: 05/02/2013 Document Reviewed: 07/12/2005 °ExitCare® Patient Information ©2015 [employer]. This information is not intended to replace advice given to you by your health care provider. Make sure you discuss any questions you have with your health care provider. ° °
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
## 2014-10-27 ENCOUNTER — Encounter: Payer: Self-pay | Admitting: Internal Medicine

## 2015-01-21 ENCOUNTER — Emergency Department
Admission: EM | Admit: 2015-01-21 | Discharge: 2015-01-21 | Disposition: A | Discharge status: Home / Self Care | Payer: Medicaid Other | Source: Home / Self Care | Attending: Family Medicine | Admitting: Family Medicine

## 2015-01-21 ENCOUNTER — Encounter: Payer: Self-pay | Admitting: Emergency Medicine

## 2015-01-21 DIAGNOSIS — R5383 Other fatigue: Secondary | ICD-10-CM | POA: Diagnosis not present

## 2015-01-21 DIAGNOSIS — R110 Nausea: Secondary | ICD-10-CM

## 2015-01-21 DIAGNOSIS — R42 Dizziness and giddiness: Secondary | ICD-10-CM

## 2015-01-21 LAB — COMPREHENSIVE METABOLIC PANEL
ALK PHOS: 84 U/L (ref 39–117)
ALT: 34 U/L (ref 0–35)
AST: 17 U/L (ref 0–37)
Albumin: 4.3 g/dL (ref 3.5–5.2)
BILIRUBIN TOTAL: 0.3 mg/dL (ref 0.2–1.2)
BUN: 14 mg/dL (ref 6–23)
CALCIUM: 9.7 mg/dL (ref 8.4–10.5)
CO2: 21 mEq/L (ref 19–32)
Chloride: 104 mEq/L (ref 96–112)
Creat: 0.78 mg/dL (ref 0.50–1.10)
Glucose, Bld: 84 mg/dL (ref 70–99)
Potassium: 4.5 mEq/L (ref 3.5–5.3)
Sodium: 138 mEq/L (ref 135–145)
TOTAL PROTEIN: 7.2 g/dL (ref 6.0–8.3)

## 2015-01-21 LAB — HEMOGLOBIN A1C
HEMOGLOBIN A1C: 5.6 % (ref <5.7)
Mean Plasma Glucose: 114 mg/dL (ref <117)

## 2015-01-21 LAB — POCT URINALYSIS DIP (MANUAL ENTRY)
Bilirubin, UA: NEGATIVE
Blood, UA: NEGATIVE
Glucose, UA: NEGATIVE
LEUKOCYTES UA: NEGATIVE
NITRITE UA: NEGATIVE
PH UA: 5.5 (ref 5–8)
PROTEIN UA: NEGATIVE
Spec Grav, UA: 1.025 (ref 1.005–1.03)
Urobilinogen, UA: 0.2 (ref 0–1)

## 2015-01-21 LAB — POCT URINE PREGNANCY: PREG TEST UR: NEGATIVE

## 2015-01-21 LAB — TSH: TSH: 0.74 u[IU]/mL (ref 0.350–4.500)

## 2015-01-21 MED ORDER — OMEPRAZOLE 20 MG PO CPDR: DELAYED_RELEASE_CAPSULE | ORAL | Status: DC

## 2015-01-21 NOTE — Discharge Instructions (Signed)

## 2015-01-21 NOTE — ED Provider Notes (Signed)
CSN: [id number]     Arrival date & time 01/21/15  1116 History   First MD Initiated Contact with Patient 01/21/15 1145     Chief Complaint  Patient presents with  . Nausea  . Dizziness      HPI Comments: Patient complains of general fatigue, and increased fatigue with exertion for about two weeks with light-headedness upon standing.  She complains of nausea after eating, and rare vomiting.  She also has nausea after smoking.  She has epigastric pain after eating.  She notes that she has increased cough upon awakening each morning with a sensation of phlegm in her throat.  Bowel movements have been normal.  No respiratory symptoms.  She has noted occasional urinary frequency but admits that she drinks a lot of water.                                                       Patient reports that her last menstrual period was about a year ago, and she has had occasional spotting since then.  However, one month ago she had recurrent vaginal bleeding that lasted a week.  She has a history of uterine fibroid that occasionally causes pain.  Chart review indicates that she had a negative endometrial biopsy 03/05/14.   Past history includes endoscopic removal of an esophageal "cyst" about a year ago in [place]. Family history includes diabetes in 3 brothers and 3 sisters.  She has a brother and two sisters who have acid reflux. She continues to smoke but states that she has cut back.                                      The history is provided by the patient.    Past Medical History  Diagnosis Date  . Fibroids   . OSA (obstructive sleep apnea)   . Panic attacks   . Anxiety   . Blood transfusion without reported diagnosis     with miscarriage  . Hypertension     denies  . Asthma     uses inhaler, but not asthma  . Depression     denies  . Arthritis    Past Surgical History  Procedure Laterality Date  . Dilation and curettage of uterus     Family History  Problem Relation Age of Onset  .  Lung cancer Mother   . Hypertension Mother   . Cancer Mother     lung  . Hypertension Father   . Heart disease Father    History  Substance Use Topics  . Smoking status: Current Every Day Smoker -- 1.00 packs/day for 15 years    Types: Cigarettes  . Smokeless tobacco: Never Used  . Alcohol Use: No   OB History    Gravida Para Term Preterm AB TAB SAB Ectopic Multiple Living   6 3 3  0 3  3 0 0 3     Review of Systems  Constitutional: Positive for activity change and fatigue. Negative for fever, chills, diaphoresis, appetite change and unexpected weight change.  HENT: Negative.   Eyes: Negative.   Respiratory: Negative.   Cardiovascular: Negative.   Gastrointestinal: Positive for nausea, vomiting and abdominal pain. Negative for diarrhea, constipation, blood in stool,  abdominal distention, anal bleeding and rectal pain.  Endocrine: Positive for polyuria. Negative for polydipsia.  Genitourinary: Positive for frequency and vaginal bleeding. Negative for dysuria, urgency, hematuria, flank pain, decreased urine volume, vaginal discharge, difficulty urinating, genital sores, vaginal pain and pelvic pain.  Musculoskeletal: Negative for myalgias, back pain, arthralgias and neck stiffness.  Skin: Negative.   Neurological: Positive for dizziness and weakness. Negative for headaches.  Psychiatric/Behavioral: Negative for sleep disturbance and dysphoric mood.    Allergies  Review of patient's allergies indicates no known allergies.  Home Medications   Prior to Admission medications   Medication Sig Start Date End Date Taking? Authorizing Provider  albuterol (PROAIR HFA) 108 (90 BASE) MCG/ACT inhaler Inhale 2 puffs into the lungs every 6 (six) hours as needed for wheezing or shortness of breath. 02/06/14   Tiffany Carlota Raspberry, PA-C  ibuprofen (ADVIL,MOTRIN) 800 MG tablet Take 1 tablet (800 mg total) by mouth 3 (three) times daily. 09/02/14   Freeman Caldron Baker, PA-C  LORazepam (ATIVAN) 1 MG tablet  Take 1 mg by mouth 3 (three) times daily as needed. 01/28/14   Historical Provider, MD  naproxen sodium (ANAPROX) 220 MG tablet Take 440 mg by mouth 2 (two) times daily with a meal.    Historical Provider, MD  omeprazole (PRILOSEC) 20 MG capsule Take one cap each evening 30 minutes before food. 01/21/15   Kandra Nicolas, MD  traMADol (ULTRAM) 50 MG tablet Take 1 tablet (50 mg total) by mouth every 6 (six) hours as needed. 09/02/14   Freeman Caldron Baker, PA-C   BP 115/76 mmHg  Pulse 95  Temp(Src) 98.4 F (36.9 C) (Oral)  Resp 16  Ht 5\' 5"  (1.651 m)  Wt 163 lb (73.936 kg)  BMI 27.12 kg/m2  SpO2 96% Physical Exam  Constitutional: She is oriented to person, place, and time. She appears well-developed and well-nourished. No distress.  HENT:  Head: Normocephalic.  Right Ear: External ear normal.  Left Ear: External ear normal.  Nose: Nose normal.  Mouth/Throat: Oropharynx is clear and moist.  Eyes: Conjunctivae are normal. Pupils are equal, round, and reactive to light.  Neck: Neck supple. No thyromegaly present.  Cardiovascular: Normal heart sounds.   Pulmonary/Chest: Breath sounds normal.  Abdominal: Bowel sounds are normal. She exhibits no mass. There is tenderness. There is no rebound and no guarding.    Distinct inferior sternum and sub-xiphoid tenderness to palpation as noted on diagram.  There is mild tenderness to palpation over uterus and bladder.  No masses palpated    Musculoskeletal: She exhibits no edema or tenderness.  Lymphadenopathy:    She has no cervical adenopathy.  Neurological: She is alert and oriented to person, place, and time.  Skin: Skin is warm and dry. No rash noted.  Nursing note and vitals reviewed.   ED Course  Procedures  None    Labs Reviewed  POCT URINALYSIS DIP (MANUAL ENTRY):  KET trace, otherwise negative  POCT URINE PREGNANCY:  Negative POCT CBC:  WBC 8.3; LY 33.1; MO 8.1; GR 58.8; Hgb 16.6; Platelets 322       MDM   1. Dizziness and  giddiness ?etiology   2. Other fatigue ?etiology   3. Nausea without vomiting; suspect GERD    Note elevated Hgb compared to normal Hgb 14.4 on 02/13/14.  Suspect secondary effect of smoking. TSH, CMP, and Hgb A1c pending. Trial of omeprazole 20mg  QD. Followup with PCP in two weeks.  Recommend annual GYN exam and pap smear.  Kandra Nicolas, MD 01/21/15 1336

## 2015-01-21 NOTE — ED Notes (Signed)
Reports 2 weeks of intermittent dizziness, nausea, occasional vomiting, urinary frequency; has kept most of today's breakfast down (egg/bacon/toast/coffee).

## 2015-01-23 ENCOUNTER — Telehealth: Payer: Self-pay | Admitting: Emergency Medicine

## 2015-01-23 DIAGNOSIS — R42 Dizziness and giddiness: Secondary | ICD-10-CM | POA: Diagnosis not present

## 2015-01-23 LAB — POCT CBC W AUTO DIFF (K'VILLE URGENT CARE)

## 2015-01-23 NOTE — ED Notes (Signed)
Inquired about patient's status; encourage them to call with questions/concerns. Stated all bloodwork was wnl.

## 2015-03-04 ENCOUNTER — Encounter: Payer: Self-pay | Admitting: Obstetrics & Gynecology

## 2015-03-04 ENCOUNTER — Ambulatory Visit (INDEPENDENT_AMBULATORY_CARE_PROVIDER_SITE_OTHER): Payer: Medicaid Other | Admitting: Obstetrics & Gynecology

## 2015-03-04 VITALS — BP 148/96 | HR 98 | Resp 16 | Ht 63.0 in | Wt 170.0 lb

## 2015-03-04 DIAGNOSIS — R102 Pelvic and perineal pain: Secondary | ICD-10-CM | POA: Diagnosis not present

## 2015-03-04 DIAGNOSIS — D251 Intramural leiomyoma of uterus: Secondary | ICD-10-CM | POA: Diagnosis not present

## 2015-03-04 NOTE — Progress Notes (Signed)
   Subjective:    Patient ID: Leslie [name], female    DOB: Sep 27, 1965, 49 y.o.   MRN: [id number]  HPI  49 yo AA P3 with known fibroids is here with the complaint of pain. I have explained that we do not use narcotics here to manage pain long term. I have discussed restarting depot Lupron versus TAH. Her sister had a TAH/BSO and lost her libido. I explained the difference to her.  Review of Systems She reports a normal pap smear at an outside office. She said that she tried different doctors as she rarely likes them.    Objective:   Physical Exam  WNWHBFNAD Breathing, conversing, and ambulating normally Abd- benign Uterus palpable up to U-3, mobile, NT       Assessment & Plan:  Fibroids and pelvic pain- She will get a 3 month depot Lupron shot tomorrow (we will order it today). In the meantime, she will do some research and she may decide on a TAH/BS in the future.

## 2015-03-06 ENCOUNTER — Ambulatory Visit (INDEPENDENT_AMBULATORY_CARE_PROVIDER_SITE_OTHER): Payer: Medicaid Other | Admitting: *Deleted

## 2015-03-06 DIAGNOSIS — D259 Leiomyoma of uterus, unspecified: Secondary | ICD-10-CM

## 2015-03-06 MED ORDER — LEUPROLIDE ACETATE (3 MONTH) 11.25 MG IM KIT
11.2500 mg | PACK | Freq: Once | INTRAMUSCULAR | Status: AC
  Administered 2015-03-06: 11.25 mg via INTRAMUSCULAR

## 2015-03-06 NOTE — Progress Notes (Signed)
Pt here for Lupron Depot 11.25 mg injection per Dr Hulan Fray.  Will return in 3 months for next injection

## 2015-06-02 ENCOUNTER — Ambulatory Visit (INDEPENDENT_AMBULATORY_CARE_PROVIDER_SITE_OTHER): Payer: Medicaid Other | Admitting: *Deleted

## 2015-06-02 DIAGNOSIS — D259 Leiomyoma of uterus, unspecified: Secondary | ICD-10-CM

## 2015-06-02 MED ORDER — LEUPROLIDE ACETATE (3 MONTH) 11.25 MG IM KIT
11.2500 mg | PACK | Freq: Once | INTRAMUSCULAR | Status: AC
  Administered 2015-06-02: 11.25 mg via INTRAMUSCULAR

## 2015-06-03 ENCOUNTER — Ambulatory Visit: Payer: Self-pay

## 2015-07-27 HISTORY — PX: ABDOMINAL HYSTERECTOMY: SHX81

## 2015-09-22 ENCOUNTER — Ambulatory Visit (INDEPENDENT_AMBULATORY_CARE_PROVIDER_SITE_OTHER): Payer: Medicaid Other | Admitting: *Deleted

## 2015-09-22 VITALS — BP 131/82 | HR 87 | Resp 16 | Wt 174.0 lb

## 2015-09-22 DIAGNOSIS — D259 Leiomyoma of uterus, unspecified: Secondary | ICD-10-CM | POA: Diagnosis not present

## 2015-09-22 MED ORDER — LEUPROLIDE ACETATE (3 MONTH) 11.25 MG IM KIT
11.2500 mg | PACK | Freq: Once | INTRAMUSCULAR | Status: AC
Start: 2015-09-22 — End: 2015-09-22
  Administered 2015-09-22: 11.25 mg via INTRAMUSCULAR

## 2015-09-22 NOTE — Progress Notes (Signed)
Pt here for Lupron Depot 11.25 injection.  She is going to schedule appt with Dr Hulan Fray to discuss surgery.

## 2015-10-02 ENCOUNTER — Ambulatory Visit (INDEPENDENT_AMBULATORY_CARE_PROVIDER_SITE_OTHER): Payer: Medicaid Other | Admitting: Obstetrics & Gynecology

## 2015-10-02 ENCOUNTER — Encounter: Payer: Self-pay | Admitting: Obstetrics & Gynecology

## 2015-10-02 VITALS — BP 149/92 | HR 106 | Resp 16 | Ht 63.0 in | Wt 175.0 lb

## 2015-10-02 DIAGNOSIS — D259 Leiomyoma of uterus, unspecified: Secondary | ICD-10-CM | POA: Diagnosis not present

## 2015-10-02 NOTE — Progress Notes (Signed)
   Subjective:    Patient ID: Leslie [name], female    DOB: 1965-10-04, 50 y.o.   MRN: [id number]  HPI 50 yo S P3 here today today discuss her desire to have a TAH/BS. She has fibroids and her main symptom is pain. She only has occasional spotting. She has been on depo Lupron for about a year. She has hot flashes, especially at night and has gained weight.   Review of Systems She has no libido. She cannot tell if she is orgasmic. She lives with her boyfriend of 5 years. 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 She is on disability for panic attacks/anxiety.    Objective:   Physical Exam  WNWHWFNAD Breathing, conversing, and ambulating normally      Assessment & Plan:  Symptomatic fibroids- plan for TAH/BS I will send [place] an email to schedule this. Schedule gyn u/s

## 2015-10-03 ENCOUNTER — Encounter (HOSPITAL_COMMUNITY): Payer: Self-pay | Admitting: *Deleted

## 2015-10-06 ENCOUNTER — Ambulatory Visit (INDEPENDENT_AMBULATORY_CARE_PROVIDER_SITE_OTHER): Payer: Medicaid Other

## 2015-10-06 DIAGNOSIS — D259 Leiomyoma of uterus, unspecified: Secondary | ICD-10-CM

## 2015-10-06 DIAGNOSIS — D252 Subserosal leiomyoma of uterus: Secondary | ICD-10-CM | POA: Diagnosis not present

## 2015-11-14 NOTE — Patient Instructions (Signed)
Your procedure is scheduled on:  Tuesday, Dec 02, 2015  Enter through the Main Entrance of [facility] at: 6:00 AM  Pick up the phone at the desk and dial [phone].  Call this number if you have problems the morning of surgery: [phone].  Remember:  Do NOT eat food or drink after:  Midnight Monday, Dec 01, 2015  Take these medicines the morning of surgery with a SIP OF WATER:  Xanax if needed  Bring Asthma Inhaler day of surgery.  Do NOT wear jewelry (body piercing), metal hair clips/bobby pins, make-up, or nail polish. Do NOT wear lotions, powders, or perfumes.  You may wear deodorant. Do NOT shave for 48 hours prior to surgery. Do NOT bring valuables to the hospital. Contacts, dentures, or bridgework may not be worn into surgery.  Leave suitcase in car.  After surgery it may be brought to your room.  For patients admitted to the hospital, checkout time is 11:00 AM the day of discharge.

## 2015-11-17 ENCOUNTER — Encounter (HOSPITAL_COMMUNITY): Payer: Self-pay

## 2015-11-17 ENCOUNTER — Encounter (HOSPITAL_COMMUNITY)
Admission: RE | Admit: 2015-11-17 | Discharge: 2015-11-17 | Disposition: A | Discharge status: Home / Self Care | Payer: Medicaid Other | Source: Ambulatory Visit | Attending: Obstetrics & Gynecology | Admitting: Obstetrics & Gynecology

## 2015-11-17 DIAGNOSIS — D259 Leiomyoma of uterus, unspecified: Secondary | ICD-10-CM | POA: Insufficient documentation

## 2015-11-17 DIAGNOSIS — G4733 Obstructive sleep apnea (adult) (pediatric): Secondary | ICD-10-CM | POA: Insufficient documentation

## 2015-11-17 DIAGNOSIS — F319 Bipolar disorder, unspecified: Secondary | ICD-10-CM | POA: Insufficient documentation

## 2015-11-17 DIAGNOSIS — F17210 Nicotine dependence, cigarettes, uncomplicated: Secondary | ICD-10-CM | POA: Insufficient documentation

## 2015-11-17 DIAGNOSIS — Z01812 Encounter for preprocedural laboratory examination: Secondary | ICD-10-CM | POA: Insufficient documentation

## 2015-11-17 DIAGNOSIS — F419 Anxiety disorder, unspecified: Secondary | ICD-10-CM | POA: Insufficient documentation

## 2015-11-17 DIAGNOSIS — K219 Gastro-esophageal reflux disease without esophagitis: Secondary | ICD-10-CM | POA: Insufficient documentation

## 2015-11-17 DIAGNOSIS — J449 Chronic obstructive pulmonary disease, unspecified: Secondary | ICD-10-CM | POA: Diagnosis not present

## 2015-11-17 HISTORY — DX: Bipolar disorder, unspecified: F31.9

## 2015-11-17 HISTORY — DX: Complete or unspecified spontaneous abortion without complication: O03.9

## 2015-11-17 HISTORY — DX: Insomnia, unspecified: G47.00

## 2015-11-17 HISTORY — DX: Headache: R51

## 2015-11-17 HISTORY — DX: Tachycardia, unspecified: R00.0

## 2015-11-17 HISTORY — DX: Chronic obstructive pulmonary disease, unspecified: J44.9

## 2015-11-17 HISTORY — DX: Anemia, unspecified: D64.9

## 2015-11-17 HISTORY — DX: Headache, unspecified: R51.9

## 2015-11-17 HISTORY — DX: Unspecified cataract: H26.9

## 2015-11-17 HISTORY — DX: Gastro-esophageal reflux disease without esophagitis: K21.9

## 2015-11-17 LAB — CBC
HCT: 44.4 % (ref 36.0–46.0)
Hemoglobin: 15.4 g/dL — ABNORMAL HIGH (ref 12.0–15.0)
MCH: 30.6 pg (ref 26.0–34.0)
MCHC: 34.7 g/dL (ref 30.0–36.0)
MCV: 88.3 fL (ref 78.0–100.0)
PLATELETS: 305 10*3/uL (ref 150–400)
RBC: 5.03 MIL/uL (ref 3.87–5.11)
RDW: 15.3 % (ref 11.5–15.5)
WBC: 8.8 10*3/uL (ref 4.0–10.5)

## 2015-11-17 LAB — ABO/RH: ABO/RH(D): A POS

## 2015-11-17 LAB — TYPE AND SCREEN
ABO/RH(D): A POS
ANTIBODY SCREEN: NEGATIVE

## 2015-12-02 ENCOUNTER — Inpatient Hospital Stay (HOSPITAL_COMMUNITY)
Admission: RE | Admit: 2015-12-02 | Discharge: 2015-12-04 | DRG: 743 | Disposition: A | Discharge status: Home / Self Care | Payer: Medicaid Other | Source: Ambulatory Visit | Attending: Obstetrics & Gynecology | Admitting: Obstetrics & Gynecology

## 2015-12-02 ENCOUNTER — Encounter (HOSPITAL_COMMUNITY): Payer: Self-pay | Admitting: Obstetrics & Gynecology

## 2015-12-02 ENCOUNTER — Inpatient Hospital Stay (HOSPITAL_COMMUNITY): Payer: Medicaid Other | Admitting: Anesthesiology

## 2015-12-02 ENCOUNTER — Encounter (HOSPITAL_COMMUNITY)
Admission: RE | Disposition: A | Discharge status: Home / Self Care | Payer: Self-pay | Source: Ambulatory Visit | Attending: Obstetrics & Gynecology

## 2015-12-02 ENCOUNTER — Encounter (HOSPITAL_COMMUNITY): Payer: Self-pay

## 2015-12-02 DIAGNOSIS — J449 Chronic obstructive pulmonary disease, unspecified: Secondary | ICD-10-CM | POA: Diagnosis present

## 2015-12-02 DIAGNOSIS — D259 Leiomyoma of uterus, unspecified: Secondary | ICD-10-CM

## 2015-12-02 DIAGNOSIS — H269 Unspecified cataract: Secondary | ICD-10-CM | POA: Diagnosis present

## 2015-12-02 DIAGNOSIS — Z8249 Family history of ischemic heart disease and other diseases of the circulatory system: Secondary | ICD-10-CM

## 2015-12-02 DIAGNOSIS — F319 Bipolar disorder, unspecified: Secondary | ICD-10-CM | POA: Diagnosis present

## 2015-12-02 DIAGNOSIS — D251 Intramural leiomyoma of uterus: Principal | ICD-10-CM | POA: Diagnosis present

## 2015-12-02 DIAGNOSIS — R109 Unspecified abdominal pain: Secondary | ICD-10-CM | POA: Diagnosis not present

## 2015-12-02 DIAGNOSIS — F17210 Nicotine dependence, cigarettes, uncomplicated: Secondary | ICD-10-CM | POA: Diagnosis present

## 2015-12-02 DIAGNOSIS — Z801 Family history of malignant neoplasm of trachea, bronchus and lung: Secondary | ICD-10-CM | POA: Diagnosis not present

## 2015-12-02 DIAGNOSIS — F418 Other specified anxiety disorders: Secondary | ICD-10-CM | POA: Diagnosis present

## 2015-12-02 DIAGNOSIS — K219 Gastro-esophageal reflux disease without esophagitis: Secondary | ICD-10-CM | POA: Diagnosis present

## 2015-12-02 DIAGNOSIS — Z9889 Other specified postprocedural states: Secondary | ICD-10-CM

## 2015-12-02 LAB — TYPE AND SCREEN
ABO/RH(D): A POS
ANTIBODY SCREEN: NEGATIVE

## 2015-12-02 LAB — PREGNANCY, URINE: Preg Test, Ur: NEGATIVE

## 2015-12-02 SURGERY — HYSTERECTOMY, TOTAL, ABDOMINAL, WITH SALPINGECTOMY
Anesthesia: General | Site: Abdomen | Laterality: Bilateral

## 2015-12-02 MED ORDER — NALOXONE HCL 0.4 MG/ML IJ SOLN: 0.4000 mg | INTRAMUSCULAR | Status: DC | PRN

## 2015-12-02 MED ORDER — FENTANYL CITRATE (PF) 100 MCG/2ML IJ SOLN
INTRAMUSCULAR | Status: AC
  Filled 2015-12-02: qty 2

## 2015-12-02 MED ORDER — ONDANSETRON HCL 4 MG/2ML IJ SOLN
INTRAMUSCULAR | Status: AC
  Filled 2015-12-02: qty 2

## 2015-12-02 MED ORDER — OLOPATADINE HCL 0.1 % OP SOLN
1.0000 [drp] | Freq: Every day | OPHTHALMIC | Status: DC
  Administered 2015-12-03 – 2015-12-04 (×2): 1 [drp] via OPHTHALMIC
  Filled 2015-12-02: qty 5

## 2015-12-02 MED ORDER — BUPIVACAINE HCL (PF) 0.5 % IJ SOLN
INTRAMUSCULAR | Status: AC
  Filled 2015-12-02: qty 30

## 2015-12-02 MED ORDER — HYDROMORPHONE 1 MG/ML IV SOLN
INTRAVENOUS | Status: DC
  Administered 2015-12-02: 2.4 mL via INTRAVENOUS
  Administered 2015-12-02: 14:00:00 via INTRAVENOUS
  Administered 2015-12-02: 5.1 mg via INTRAVENOUS
  Administered 2015-12-03: 2.4 mg via INTRAVENOUS
  Administered 2015-12-03: 1.2 mg via INTRAVENOUS
  Filled 2015-12-02: qty 25

## 2015-12-02 MED ORDER — FENTANYL CITRATE (PF) 250 MCG/5ML IJ SOLN
INTRAMUSCULAR | Status: AC
  Filled 2015-12-02: qty 5

## 2015-12-02 MED ORDER — IBUPROFEN 800 MG PO TABS
800.0000 mg | ORAL_TABLET | Freq: Three times a day (TID) | ORAL | Status: DC | PRN
  Administered 2015-12-03 – 2015-12-04 (×2): 800 mg via ORAL
  Filled 2015-12-02 (×2): qty 1

## 2015-12-02 MED ORDER — GLYCOPYRROLATE 0.2 MG/ML IJ SOLN
INTRAMUSCULAR | Status: DC | PRN
  Administered 2015-12-02: 0.4 mg via INTRAVENOUS

## 2015-12-02 MED ORDER — FENTANYL CITRATE (PF) 100 MCG/2ML IJ SOLN
25.0000 ug | INTRAMUSCULAR | Status: DC | PRN
  Administered 2015-12-02: 25 ug via INTRAVENOUS

## 2015-12-02 MED ORDER — LIDOCAINE HCL (CARDIAC) 20 MG/ML IV SOLN
INTRAVENOUS | Status: DC | PRN
  Administered 2015-12-02: 60 mg via INTRAVENOUS

## 2015-12-02 MED ORDER — ROCURONIUM BROMIDE 100 MG/10ML IV SOLN
INTRAVENOUS | Status: AC
  Filled 2015-12-02: qty 1

## 2015-12-02 MED ORDER — DIPHENHYDRAMINE HCL 50 MG/ML IJ SOLN: 12.5000 mg | Freq: Four times a day (QID) | INTRAMUSCULAR | Status: DC | PRN

## 2015-12-02 MED ORDER — NEOSTIGMINE METHYLSULFATE 10 MG/10ML IV SOLN
INTRAVENOUS | Status: DC | PRN
  Administered 2015-12-02: 2.5 mg via INTRAVENOUS

## 2015-12-02 MED ORDER — ONDANSETRON HCL 4 MG PO TABS: 4.0000 mg | ORAL_TABLET | Freq: Four times a day (QID) | ORAL | Status: DC | PRN

## 2015-12-02 MED ORDER — HYDROMORPHONE HCL 1 MG/ML IJ SOLN
INTRAMUSCULAR | Status: DC | PRN
  Administered 2015-12-02: 1 mg via INTRAVENOUS

## 2015-12-02 MED ORDER — HYDROMORPHONE HCL 1 MG/ML IJ SOLN
INTRAMUSCULAR | Status: AC
  Filled 2015-12-02: qty 1

## 2015-12-02 MED ORDER — LACTATED RINGERS IV SOLN
INTRAVENOUS | Status: DC
  Administered 2015-12-02 – 2015-12-03 (×4): via INTRAVENOUS

## 2015-12-02 MED ORDER — PROPOFOL 10 MG/ML IV BOLUS
INTRAVENOUS | Status: DC | PRN
  Administered 2015-12-02: 200 mg via INTRAVENOUS

## 2015-12-02 MED ORDER — ALBUTEROL SULFATE (2.5 MG/3ML) 0.083% IN NEBU
2.5000 mg | INHALATION_SOLUTION | Freq: Four times a day (QID) | RESPIRATORY_TRACT | Status: DC | PRN
  Filled 2015-12-02: qty 3

## 2015-12-02 MED ORDER — ONDANSETRON HCL 4 MG/2ML IJ SOLN
4.0000 mg | Freq: Four times a day (QID) | INTRAMUSCULAR | Status: DC | PRN
Start: 2015-12-02 — End: 2015-12-03

## 2015-12-02 MED ORDER — DEXAMETHASONE SODIUM PHOSPHATE 10 MG/ML IJ SOLN
INTRAMUSCULAR | Status: AC
  Filled 2015-12-02: qty 1

## 2015-12-02 MED ORDER — SCOPOLAMINE 1 MG/3DAYS TD PT72
MEDICATED_PATCH | TRANSDERMAL | Status: AC
  Administered 2015-12-02: 1.5 mg via TRANSDERMAL
  Filled 2015-12-02: qty 1

## 2015-12-02 MED ORDER — SCOPOLAMINE 1 MG/3DAYS TD PT72
1.0000 | MEDICATED_PATCH | Freq: Once | TRANSDERMAL | Status: DC
  Administered 2015-12-02: 1.5 mg via TRANSDERMAL

## 2015-12-02 MED ORDER — MIDAZOLAM HCL 2 MG/2ML IJ SOLN
INTRAMUSCULAR | Status: AC
  Filled 2015-12-02: qty 2

## 2015-12-02 MED ORDER — LACTATED RINGERS IV SOLN
INTRAVENOUS | Status: DC
  Administered 2015-12-02: 10 mL/h via INTRAVENOUS
  Administered 2015-12-02: 08:00:00 via INTRAVENOUS

## 2015-12-02 MED ORDER — OXYCODONE HCL 5 MG PO TABS: 5.0000 mg | ORAL_TABLET | Freq: Once | ORAL | Status: DC | PRN

## 2015-12-02 MED ORDER — SODIUM CHLORIDE 0.9% FLUSH: 9.0000 mL | INTRAVENOUS | Status: DC | PRN

## 2015-12-02 MED ORDER — KETOROLAC TROMETHAMINE 30 MG/ML IJ SOLN
30.0000 mg | Freq: Four times a day (QID) | INTRAMUSCULAR | Status: DC
  Administered 2015-12-02 – 2015-12-03 (×4): 30 mg via INTRAVENOUS
  Filled 2015-12-02 (×4): qty 1

## 2015-12-02 MED ORDER — OXYCODONE-ACETAMINOPHEN 5-325 MG PO TABS
1.0000 | ORAL_TABLET | ORAL | Status: DC | PRN
  Administered 2015-12-03 – 2015-12-04 (×7): 2 via ORAL
  Filled 2015-12-02 (×7): qty 2

## 2015-12-02 MED ORDER — CEFAZOLIN SODIUM-DEXTROSE 2-4 GM/100ML-% IV SOLN
2.0000 g | INTRAVENOUS | Status: AC
  Administered 2015-12-02: 2 g via INTRAVENOUS
  Filled 2015-12-02: qty 100

## 2015-12-02 MED ORDER — BUPIVACAINE HCL (PF) 0.5 % IJ SOLN
INTRAMUSCULAR | Status: DC | PRN
  Administered 2015-12-02: 30 mL

## 2015-12-02 MED ORDER — ONDANSETRON HCL 4 MG/2ML IJ SOLN
INTRAMUSCULAR | Status: DC | PRN
  Administered 2015-12-02: 4 mg via INTRAVENOUS

## 2015-12-02 MED ORDER — DIPHENHYDRAMINE HCL 12.5 MG/5ML PO ELIX: 12.5000 mg | ORAL_SOLUTION | Freq: Four times a day (QID) | ORAL | Status: DC | PRN

## 2015-12-02 MED ORDER — OXYCODONE HCL 5 MG/5ML PO SOLN: 5.0000 mg | Freq: Once | ORAL | Status: DC | PRN

## 2015-12-02 MED ORDER — ROCURONIUM BROMIDE 100 MG/10ML IV SOLN
INTRAVENOUS | Status: DC | PRN
  Administered 2015-12-02: 10 mg via INTRAVENOUS
  Administered 2015-12-02: 40 mg via INTRAVENOUS
  Administered 2015-12-02: 10 mg via INTRAVENOUS

## 2015-12-02 MED ORDER — DEXAMETHASONE SODIUM PHOSPHATE 10 MG/ML IJ SOLN
INTRAMUSCULAR | Status: DC | PRN
  Administered 2015-12-02: 10 mg via INTRAVENOUS

## 2015-12-02 MED ORDER — CYCLOSPORINE 0.05 % OP EMUL
1.0000 [drp] | Freq: Two times a day (BID) | OPHTHALMIC | Status: DC
  Administered 2015-12-02 – 2015-12-04 (×4): 1 [drp] via OPHTHALMIC
  Filled 2015-12-02 (×7): qty 1

## 2015-12-02 MED ORDER — MIDAZOLAM HCL 5 MG/5ML IJ SOLN
INTRAMUSCULAR | Status: DC | PRN
  Administered 2015-12-02: 2 mg via INTRAVENOUS

## 2015-12-02 MED ORDER — CEFAZOLIN SODIUM-DEXTROSE 2-3 GM-% IV SOLR
INTRAVENOUS | Status: AC
  Filled 2015-12-02: qty 50

## 2015-12-02 MED ORDER — ONDANSETRON HCL 4 MG/2ML IJ SOLN: 4.0000 mg | Freq: Four times a day (QID) | INTRAMUSCULAR | Status: DC | PRN

## 2015-12-02 MED ORDER — HYDROMORPHONE HCL 1 MG/ML IJ SOLN
0.2000 mg | INTRAMUSCULAR | Status: DC | PRN
  Administered 2015-12-02 (×2): 0.6 mg via INTRAVENOUS
  Filled 2015-12-02 (×2): qty 1

## 2015-12-02 MED ORDER — FENTANYL CITRATE (PF) 100 MCG/2ML IJ SOLN
INTRAMUSCULAR | Status: DC | PRN
  Administered 2015-12-02: 50 ug via INTRAVENOUS
  Administered 2015-12-02 (×3): 100 ug via INTRAVENOUS

## 2015-12-02 MED ORDER — ONDANSETRON HCL 4 MG/2ML IJ SOLN: 4.0000 mg | Freq: Once | INTRAMUSCULAR | Status: DC | PRN

## 2015-12-02 MED ORDER — LIDOCAINE HCL (CARDIAC) 20 MG/ML IV SOLN
INTRAVENOUS | Status: AC
  Filled 2015-12-02: qty 5

## 2015-12-02 MED ORDER — ZOLPIDEM TARTRATE 5 MG PO TABS
5.0000 mg | ORAL_TABLET | Freq: Every day | ORAL | Status: DC
  Administered 2015-12-03: 5 mg via ORAL
  Filled 2015-12-02: qty 1

## 2015-12-02 MED ORDER — ALPRAZOLAM 0.5 MG PO TABS
0.5000 mg | ORAL_TABLET | Freq: Three times a day (TID) | ORAL | Status: DC
  Administered 2015-12-02 – 2015-12-04 (×6): 0.5 mg via ORAL
  Filled 2015-12-02 (×6): qty 1

## 2015-12-02 SURGICAL SUPPLY — 34 items
CANISTER SUCT 3000ML (MISCELLANEOUS) ×3 IMPLANT
CLOSURE WOUND 1/2 X4 (GAUZE/BANDAGES/DRESSINGS) ×1
CLOTH BEACON ORANGE TIMEOUT ST (SAFETY) ×3 IMPLANT
CONT PATH 16OZ SNAP LID 3702 (MISCELLANEOUS) ×3 IMPLANT
DECANTER SPIKE VIAL GLASS SM (MISCELLANEOUS) IMPLANT
DRAPE CESAREAN BIRTH W POUCH (DRAPES) ×3 IMPLANT
DRAPE WARM FLUID 44X44 (DRAPE) IMPLANT
DRSG OPSITE POSTOP 4X10 (GAUZE/BANDAGES/DRESSINGS) ×3 IMPLANT
DURAPREP 26ML APPLICATOR (WOUND CARE) ×3 IMPLANT
GAUZE SPONGE 4X4 16PLY XRAY LF (GAUZE/BANDAGES/DRESSINGS) IMPLANT
GLOVE BIO SURGEON STRL SZ 6.5 (GLOVE) ×2 IMPLANT
GLOVE BIO SURGEONS STRL SZ 6.5 (GLOVE) ×1
GLOVE BIOGEL PI IND STRL 7.0 (GLOVE) ×3 IMPLANT
GLOVE BIOGEL PI INDICATOR 7.0 (GLOVE) ×6
GOWN STRL REUS W/TWL LRG LVL3 (GOWN DISPOSABLE) ×9 IMPLANT
NDL SPNL 18GX3.5 QUINCKE PK (NEEDLE) ×1 IMPLANT
NEEDLE SPNL 18GX3.5 QUINCKE PK (NEEDLE) ×3 IMPLANT
NS IRRIG 1000ML POUR BTL (IV SOLUTION) ×3 IMPLANT
PACK ABDOMINAL GYN (CUSTOM PROCEDURE TRAY) ×3 IMPLANT
PAD OB MATERNITY 4.3X12.25 (PERSONAL CARE ITEMS) ×3 IMPLANT
PENCIL SMOKE EVAC W/HOLSTER (ELECTROSURGICAL) ×3 IMPLANT
SPONGE LAP 18X18 X RAY DECT (DISPOSABLE) ×6 IMPLANT
STRIP CLOSURE SKIN 1/2X4 (GAUZE/BANDAGES/DRESSINGS) ×2 IMPLANT
SUT CHROMIC 3 0 SH 27 (SUTURE) IMPLANT
SUT PDS AB 0 CTX 60 (SUTURE) ×3 IMPLANT
SUT VIC AB 0 CT1 36 (SUTURE) ×3 IMPLANT
SUT VIC AB 2-0 CT1 18 (SUTURE) ×11 IMPLANT
SUT VIC AB 3-0 CT1 27 (SUTURE) ×3
SUT VIC AB 3-0 CT1 TAPERPNT 27 (SUTURE) ×1 IMPLANT
SYR 30ML LL (SYRINGE) ×3 IMPLANT
TOWEL OR 17X24 6PK STRL BLUE (TOWEL DISPOSABLE) ×6 IMPLANT
TRAY FOLEY CATH SILVER 14FR (SET/KITS/TRAYS/PACK) ×3 IMPLANT
WATER STERILE IRR 1000ML POUR (IV SOLUTION) ×1 IMPLANT
YANKAUER SUCT BULB TIP NO VENT (SUCTIONS) ×2 IMPLANT

## 2015-12-02 NOTE — Transfer of Care (Signed)
Immediate Anesthesia Transfer of Care Note  Patient: Leslie [name]  Procedure(s) Performed: Procedure(s): HYSTERECTOMY ABDOMINAL WITH SALPINGECTOMY (Bilateral)  Patient Location: PACU  Anesthesia Type:General  Level of Consciousness: awake, alert  and oriented  Airway & Oxygen Therapy: Patient Spontanous Breathing and Patient connected to nasal cannula oxygen  Post-op Assessment: Report given to RN and Post -op Vital signs reviewed and stable  Post vital signs: Reviewed and stable  Last Vitals:  Filed Vitals:   12/02/15 0631  BP: 125/92  Pulse: 86  Temp: 36.8 C  Resp: 20    Last Pain: There were no vitals filed for this visit.    Patients Stated Pain Goal: 4 ([id number])  Complications: No apparent anesthesia complications

## 2015-12-02 NOTE — H&P (Signed)
Leslie [name] is an 50 y.o. SW P3 with known fibroids is here with the complaint of pain. I have explained that we do not use narcotics here to manage pain long term. I have discussed restarting depot Lupron versus TAH. Her sister had a TAH/BSO and lost her libido. I explained the difference to her.    No LMP recorded. Patient is not currently having periods (Reason: Other).    Past Medical History  Diagnosis Date  . Fibroids   . Panic attacks   . Anxiety   . Blood transfusion without reported diagnosis     with miscarriage  . Asthma     uses inhaler, but not asthma  . Depression     denies  . Hypertension     reports past history  . Bipolar disorder ([place])   . Rapid heart beat     with anxiety  . Insomnia   . OSA (obstructive sleep apnea)     patient denies, does not use CPAP  . COPD (chronic obstructive pulmonary disease) ([employer])   . GERD (gastroesophageal reflux disease)   . Headache     located back of head daily, this week  . Arthritis     right arm  . Anemia     history of  . Miscarriage   . Cataract     right eye    Past Surgical History  Procedure Laterality Date  . Dilation and curettage of uterus    . Cyst removed       esophagus    Family History  Problem Relation Age of Onset  . Lung cancer Mother   . Hypertension Mother   . Cancer Mother     lung  . Hypertension Father   . Heart disease Father     Social History:  reports that she has been smoking Cigarettes.  She has a 15 pack-year smoking history. She has never used smokeless tobacco. She reports that she does not drink alcohol or use illicit drugs.  Allergies: No Known Allergies  Prescriptions prior to admission  Medication Sig Dispense Refill Last Dose  . acetaminophen (TYLENOL) 500 MG tablet Take 500 mg by mouth every 4 (four) hours as needed for moderate pain.   Past Month at Unknown time  . albuterol (PROAIR HFA) 108 (90 BASE) MCG/ACT inhaler Inhale 2 puffs into the lungs every 6 (six)  hours as needed for wheezing or shortness of breath. 1 Inhaler 0 12/01/2015 at 2000  . ALPRAZolam (XANAX) 0.5 MG tablet Take 0.5 mg by mouth 3 (three) times daily.  0 12/01/2015 at 2000  . PAZEO 0.7 % SOLN Place 1 drop into both eyes daily.   3 12/01/2015 at 2000  . RESTASIS 0.05 % ophthalmic emulsion Place 1 drop into both eyes 2 (two) times daily.  3 Past Week at Unknown time  . SYMBICORT 160-4.5 MCG/ACT inhaler Inhale 2 puffs into the lungs 2 (two) times daily.  12 12/01/2015 at 2000  . zolpidem (AMBIEN) 10 MG tablet TAKE 1 TABLET AT BEDTIME  0 11/30/2015    ROS  She is on disability due to her psych disorders. She lives with her boyfriend for the last 6 years. She has some pain with sex.  Blood pressure 125/92, pulse 86, temperature 98.2 F (36.8 C), temperature source Oral, resp. rate 20, SpO2 98 %. Physical Exam Heart- rrr Lungs- CTAB Abd- benign  Results for orders placed or performed during the hospital encounter of 12/02/15 (from the past 24 hour(s))  Pregnancy, urine     Status: None   Collection Time: 12/02/15  6:15 AM  Result Value Ref Range   Preg Test, Ur NEGATIVE NEGATIVE    No results found.  Assessment/Plan: Large fibroids- causing pain Plan for TAH/BS  Lucus Lambertson C. 12/02/2015, 6:59 AM

## 2015-12-02 NOTE — Progress Notes (Signed)
I had received a referral from [place] who saw Leslie [name] this morning before her surgery for prayer, at pt's request.  I met her as she was coming onto the [facility] unit post-operatively.  She was in significant pain and I spent time with her as she got settled in the room and stayed until her pain became a bit more manageable.  Chaplain Janne Napoleon, [place] Pager, [phone] 1:27 PM    12/02/15 1300  Clinical Encounter Type  Visited With Patient;Family  Visit Type Follow-up;Spiritual support  Referral From Chaplain;Nurse

## 2015-12-02 NOTE — Anesthesia Postprocedure Evaluation (Signed)
Anesthesia Post Note  Patient: Leslie [name]  Procedure(s) Performed: Procedure(s) (LRB): HYSTERECTOMY ABDOMINAL WITH SALPINGECTOMY (Bilateral)  Patient location during evaluation: PACU Anesthesia Type: General Level of consciousness: awake, awake and alert and oriented Pain management: pain level controlled Respiratory status: spontaneous breathing, nonlabored ventilation and respiratory function stable Cardiovascular status: blood pressure returned to baseline Anesthetic complications: no    Last Vitals:  Filed Vitals:   12/02/15 1030 12/02/15 1044  BP: 107/67 100/65  Pulse: 77 81  Temp:  36.7 C  Resp: 20 15    Last Pain:  Filed Vitals:   12/02/15 1045  PainSc: 10-Worst pain ever                 Nyheim Seufert COKER

## 2015-12-02 NOTE — Anesthesia Procedure Notes (Signed)
Procedure Name: Intubation Date/Time: 12/02/2015 7:29 AM Performed by: Vernice Jefferson Pre-anesthesia Checklist: Patient identified, Emergency Drugs available, Suction available, Patient being monitored and Timeout performed Patient Re-evaluated:Patient Re-evaluated prior to inductionOxygen Delivery Method: Circle system utilized Preoxygenation: Pre-oxygenation with 100% oxygen Intubation Type: IV induction Ventilation: Mask ventilation without difficulty Laryngoscope Size: Mac and 3 Grade View: Grade II Tube size: 7.0 mm Number of attempts: 1 Placement Confirmation: ETT inserted through vocal cords under direct vision,  positive ETCO2 and breath sounds checked- equal and bilateral Secured at: 22 cm Tube secured with: Tape Dental Injury: Teeth and Oropharynx as per pre-operative assessment

## 2015-12-02 NOTE — Progress Notes (Signed)
Called by nursing to assess this patient for a breathing treatment.  Patient asleep when I entered her room.  Patient was on oxygen 2 lpm with oxygen saturations of 96% and in no apparent distress.  Breathing was unlabored with respiratory rate of 12-16 breaths per minute.  I assessed this patient and found breath sounds with a little rhonchi which cleared with cough.  No wheezing was heard.  Worked with patient with Incentive Spirometry and coughing with use of splinting.  Discussed my assessment with her nurse.  No treatment given at this time.  Patient and her nurse instructed to call if any further respiratory care needed.

## 2015-12-02 NOTE — Discharge Instructions (Signed)
Abdominal Hysterectomy, Care After °Refer to this sheet in the next few weeks. These instructions provide you with information on caring for yourself after your procedure. Your health care provider may also give you more specific instructions. Your treatment has been planned according to current medical practices, but problems sometimes occur. Call your health care provider if you have any problems or questions after your procedure.  °WHAT TO EXPECT AFTER THE PROCEDURE °After your procedure, it is typical to have the following: °· Pain. °· Feeling tired. °· Poor appetite. °· Less interest in sex. °It takes 4-6 weeks to recover from this surgery.  °HOME CARE INSTRUCTIONS  °· Take pain medicines only as directed by your health care provider. Do not take over-the-counter pain medicines without checking with your health care provider first.  °· Change your bandage as directed by your health care provider. °· Return to your health care provider to have your sutures taken out. °· Take showers instead of baths for 2-3 weeks. Ask your health care provider when it is safe to start showering.  °· Do not douche, use tampons, or have sexual intercourse for at least 6 weeks or until your health care provider says you can.   °· Follow your health care provider's advice about exercise, lifting, driving, and general activities. °· Get plenty of rest and sleep.   °· Do not lift anything heavier than a gallon of milk (about 10 lb [4.5 kg]) for the first month after surgery. °· You can resume your normal diet if your health care provider says it is okay.   °· Do not drink alcohol until your health care provider says you can.   °· If you are constipated, ask your health care provider if you can take a mild laxative. °· Eating foods high in fiber may also help with constipation. Eat plenty of raw fruits and vegetables, whole grains, and beans. °· Drink enough fluids to keep your urine clear or pale yellow.   °· Try to have someone at  home with you for the first 1-2 weeks to help around the house. °· Keep all follow-up appointments. °SEEK MEDICAL CARE IF:  °· You have chills or fever. °· You have swelling, redness, or pain in the area of your incision that is getting worse.   °· You have pus coming from the incision.   °· You notice a bad smell coming from the incision or bandage.   °· Your incision breaks open.   °· You feel dizzy or light-headed.   °· You have pain or bleeding when you urinate.   °· You have persistent diarrhea.   °· You have persistent nausea and vomiting.   °· You have abnormal vaginal discharge.   °· You have a rash.   °· You have any type of abnormal reaction or develop an allergy to your medicine.   °· Your pain medicine is not helping.   °SEEK IMMEDIATE MEDICAL CARE IF:  °· You have a fever and your symptoms suddenly get worse. °· You have severe abdominal pain. °· You have chest pain. °· You have shortness of breath. °· You faint. °· You have pain, swelling, or redness of your leg. °· You have heavy vaginal bleeding with blood clots. °MAKE SURE YOU: °· Understand these instructions. °· Will watch your condition. °· Will get help right away if you are not doing well or get worse. °  °This information is not intended to replace advice given to you by your health care provider. Make sure you discuss any questions you have with your health care provider. °  °Document   Released: 01/29/2005 Document Revised: 08/02/2014 Document Reviewed: 05/04/2013 °Elsevier Interactive Patient Education ©2016 Elsevier Inc. ° °

## 2015-12-02 NOTE — Progress Notes (Addendum)
At Leslie [name] request chaplain came to pray with her and her SO, Leslie [name], prior to her procedure. Leslie [name] was very frightened and apprehensive, but was comforted by the professionalism and care of the staff.   Page chaplain if spiritual care is needed after Leslie Barret' procedure or any time thereafter.  Sallee Lange. Jayle Solarz, DMin, MDiv Chaplain

## 2015-12-02 NOTE — Op Note (Signed)
12/02/2015  9:04 AM  PATIENT:  Leslie [name]  50 y.o. female  PRE-OPERATIVE DIAGNOSIS:  cpt 58150 - Symptomatic fibroids with abdominal pain  POST-OPERATIVE DIAGNOSIS:  cpt 58150 - Symptomatic fibroids with abdominal pain  PROCEDURE:  Procedure(s): HYSTERECTOMY ABDOMINAL WITH SALPINGECTOMY (Bilateral)  SURGEON:  Surgeon(s) and Role:    * Emily Filbert, MD - Primary    * Lavonia Drafts, MD - Assisting   ANESTHESIA:   general  EBL:  Total I/O In: -  Out: 575 [Urine:275; Blood:300]  BLOOD ADMINISTERED:none  DRAINS: none   LOCAL MEDICATIONS USED:  MARCAINE     SPECIMEN:  Source of Specimen:  uterus, tubes  DISPOSITION OF SPECIMEN:  PATHOLOGY  COUNTS:  YES  TOURNIQUET:  * No tourniquets in log *  DICTATION: .Dragon Dictation  PLAN OF CARE: Admit to inpatient   PATIENT DISPOSITION:  PACU - hemodynamically stable.   Delay start of Pharmacological VTE agent (>24hrs) due to surgical blood loss or risk of bleeding: not applicable     The risks, benefits, and alternatives of surgery were explained, understood, and accepted. Consents were signed. All questions were answered. She was taken to the operating room and general anesthesia was applied without complication. Her abdomen and vagina were prepped and draped in the usual sterile fashion. A Foley catheter was placed which drained clear urine throughout the case.  A transverse incision was made approximately 2 cm above her symphysis pubis after injecting 30 mL of 0.5% marcaine in the subcutaneous tissue. The incision was carried down through the subcutaneous tissue to the fascia. Bleeding encountered was cauterized with the Bovie. The fascia was scored the midline and the fascial incision was extended bilaterally. The pyramidalis muscles were separated in a transverse fashion using electrosurgical technique. Approximately 2 cm of the rectus muscles were separated in a transverse fashion in the midline using electrosurgical  technique. Hemostasis was maintained. The peritoneum was entered with hemostats and the peritoneal incision was extended bilaterally with the Bovie, taking care to avoid bowel and bladder. The patient was placed in Trendelenburg position and her bowel was packed out of the abdominal cavity. The pelvis was inspected. Her very large uterus filled the entire pelvis. I used towel clamps to elevate the uterus out of the incision. Coker clamps were used to elevate the uterus. The round ligaments were identified clamped cut and ligated. A bladder flap was created anteriorly and the bladder was pushed out of the operative site with a moist lap sponge. The oviducts were removed and the pedicles were noted to be hemostatic. The uteroovarian ligaments were identified bilaterally. They were clamped, cut, and ligated. Excellent hemostasis was noted. 2-0 Vicryl sutures used throughout this case unless otherwise specified. The uterine vessels were skeletonized, clamped, cut, and doubly ligated. A bladder flap was created anteriorly. The remainder of the cervix was separated from its pelvic attachments using the same clamp, cut, ligate technique. Curved Heaney clamps were used to clamp beneath the cervix. The cervix and uterus were removed and sent to pathology. The vaginal cuff was noted to be hemostatic after placing 2 figure of eight sutures. The pelvis was irrigated with 1 L of warm normal saline. All pedicles were noted to be hemostatic.  The ureters were noted to be functioning and of normal caliber. The sponges were removed from the pelvis. The rectus muscles were inspected and hemostasis was assured. The fascia was closed with a 0 Vicryl running nonlocking suture. The subcutaneous tissue was irrigated, clean, dry.  A subcuticular closure was done with 3-0 vicryl suture. She tolerated the procedure well and was taken to the recovery room in stable condition. Her Foley catheter drained clear urine throughout.

## 2015-12-02 NOTE — Anesthesia Preprocedure Evaluation (Signed)
Anesthesia Evaluation  Patient identified by MRN, date of birth, ID band Patient awake    Reviewed: Allergy & Precautions, NPO status , Patient's Chart, lab work & pertinent test results  Airway Mallampati: II  TM Distance: >3 FB Neck ROM: Full    Dental   Pulmonary Current Smoker,    breath sounds clear to auscultation       Cardiovascular hypertension,  Rhythm:Regular     Neuro/Psych    GI/Hepatic   Endo/Other    Renal/GU      Musculoskeletal   Abdominal   Peds  Hematology   Anesthesia Other Findings   Reproductive/Obstetrics                             Anesthesia Physical Anesthesia Plan  ASA: III  Anesthesia Plan: General   Post-op Pain Management:    Induction: Intravenous  Airway Management Planned: Oral ETT  Additional Equipment:   Intra-op Plan:   Post-operative Plan: Extubation in OR  Informed Consent: I have reviewed the patients History and Physical, chart, labs and discussed the procedure including the risks, benefits and alternatives for the proposed anesthesia with the patient or authorized representative who has indicated his/her understanding and acceptance.   Dental advisory given  Plan Discussed with: CRNA and Anesthesiologist  Anesthesia Plan Comments:         Anesthesia Quick Evaluation

## 2015-12-03 LAB — CBC
HCT: 35.4 % — ABNORMAL LOW (ref 36.0–46.0)
Hemoglobin: 11.7 g/dL — ABNORMAL LOW (ref 12.0–15.0)
MCH: 28.7 pg (ref 26.0–34.0)
MCHC: 33.1 g/dL (ref 30.0–36.0)
MCV: 87 fL (ref 78.0–100.0)
PLATELETS: 361 10*3/uL (ref 150–400)
RBC: 4.07 MIL/uL (ref 3.87–5.11)
RDW: 14.7 % (ref 11.5–15.5)
WBC: 11.9 10*3/uL — ABNORMAL HIGH (ref 4.0–10.5)

## 2015-12-03 NOTE — Progress Notes (Signed)
1 Day Post-Op Procedure(s) (LRB): HYSTERECTOMY ABDOMINAL WITH SALPINGECTOMY (Bilateral)  Subjective: Patient reports tolerating PO.    Objective: I have reviewed patient's vital signs, intake and output, medications and labs.  General: alert Resp: clear to auscultation bilaterally Cardio: regular rate and rhythm, S1, S2 normal, no murmur, click, rub or gallop GI: soft, non-tender; bowel sounds normal; no masses,  no organomegaly Extremities: extremities normal, atraumatic, no cyanosis or edema  Some tympany on abdominal exam Dressing: relatively clean/dry/intact Assessment: s/p Procedure(s): HYSTERECTOMY ABDOMINAL WITH SALPINGECTOMY (Bilateral): stable  Plan: Advance diet Encourage ambulation Advance to PO medication Discontinue IV fluids  LOS: 1 day    Lendy Dittrich C. 12/03/2015, 8:03 AM

## 2015-12-04 MED ORDER — IBUPROFEN 800 MG PO TABS: 800.0000 mg | ORAL_TABLET | Freq: Three times a day (TID) | ORAL | Status: AC | PRN

## 2015-12-04 MED ORDER — OXYCODONE-ACETAMINOPHEN 5-325 MG PO TABS: 1.0000 | ORAL_TABLET | ORAL | Status: DC | PRN

## 2015-12-04 NOTE — Discharge Summary (Signed)
Physician Discharge Summary  Patient ID: Leslie [name] MRN: [id number] DOB/AGE: 52-Jun-1967 52 y.o.  Admit date: 12/02/2015 Discharge date: 12/04/2015  Admission Diagnoses: symptomatic fibroids  Discharge Diagnoses: same Active Problems:   Post-operative state   Discharged Condition: good  Hospital Course: She underwent an uncomplicated TAH/BS with general anesthesia. By POD #1 she was voiding, tolerating po, having flatus, and voiding without difficulty. By POD#2 she voiced her readiness to go home. Her vitals were stable throughout her hospital course.  Consults: None  Significant Diagnostic Studies: labs: post op hbg 11.7  Treatments: surgery: as above  Discharge Exam: Blood pressure 116/59, pulse 80, temperature 98.4 F (36.9 C), temperature source Oral, resp. rate 20, height 5\' 5"  (1.651 m), weight 174 lb 2 oz (78.983 kg), SpO2 95 %. General appearance: alert Resp: clear to auscultation bilaterally Cardio: regular rate and rhythm, S1, S2 normal, no murmur, click, rub or gallop GI: soft, non-tender; bowel sounds normal; no masses,  no organomegaly Incision/Wound: dressing c/d/i  Disposition: 01-Home or Self Care     Medication List    STOP taking these medications        acetaminophen 500 MG tablet  Commonly known as:  TYLENOL      TAKE these medications        albuterol 108 (90 Base) MCG/ACT inhaler  Commonly known as:  PROAIR HFA  Inhale 2 puffs into the lungs every 6 (six) hours as needed for wheezing or shortness of breath.     ALPRAZolam 0.5 MG tablet  Commonly known as:  XANAX  Take 0.5 mg by mouth 3 (three) times daily.     ibuprofen 800 MG tablet  Commonly known as:  ADVIL,MOTRIN  Take 1 tablet (800 mg total) by mouth every 8 (eight) hours as needed (mild pain).     oxyCODONE-acetaminophen 5-325 MG tablet  Commonly known as:  PERCOCET/ROXICET  Take 1-2 tablets by mouth every 4 (four) hours as needed for severe pain (moderate to severe pain (when  tolerating fluids)).     PAZEO 0.7 % Soln  Generic drug:  Olopatadine HCl  Place 1 drop into both eyes daily.     RESTASIS 0.05 % ophthalmic emulsion  Generic drug:  cycloSPORINE  Place 1 drop into both eyes 2 (two) times daily.     SYMBICORT 160-4.5 MCG/ACT inhaler  Generic drug:  budesonide-formoterol  Inhale 2 puffs into the lungs 2 (two) times daily.     zolpidem 10 MG tablet  Commonly known as:  AMBIEN  TAKE 1 TABLET AT BEDTIME         Signed: Rhylie Stehr C. 12/04/2015, 12:45 PM

## 2015-12-04 NOTE — Progress Notes (Signed)
Pt teaching  Complete   Ambulated out

## 2015-12-09 ENCOUNTER — Encounter (HOSPITAL_COMMUNITY): Payer: Self-pay | Admitting: *Deleted

## 2015-12-09 ENCOUNTER — Inpatient Hospital Stay (HOSPITAL_COMMUNITY)
Admission: AD | Admit: 2015-12-09 | Discharge: 2015-12-09 | Disposition: A | Discharge status: Home / Self Care | Payer: Medicaid Other | Source: Ambulatory Visit | Attending: Obstetrics & Gynecology | Admitting: Obstetrics & Gynecology

## 2015-12-09 ENCOUNTER — Telehealth: Payer: Self-pay

## 2015-12-09 DIAGNOSIS — J449 Chronic obstructive pulmonary disease, unspecified: Secondary | ICD-10-CM | POA: Diagnosis not present

## 2015-12-09 DIAGNOSIS — F17210 Nicotine dependence, cigarettes, uncomplicated: Secondary | ICD-10-CM | POA: Insufficient documentation

## 2015-12-09 DIAGNOSIS — G8918 Other acute postprocedural pain: Secondary | ICD-10-CM | POA: Diagnosis not present

## 2015-12-09 DIAGNOSIS — K219 Gastro-esophageal reflux disease without esophagitis: Secondary | ICD-10-CM | POA: Diagnosis not present

## 2015-12-09 DIAGNOSIS — T8189XA Other complications of procedures, not elsewhere classified, initial encounter: Secondary | ICD-10-CM

## 2015-12-09 DIAGNOSIS — R51 Headache: Secondary | ICD-10-CM | POA: Insufficient documentation

## 2015-12-09 LAB — URINALYSIS, ROUTINE W REFLEX MICROSCOPIC
Bilirubin Urine: NEGATIVE
GLUCOSE, UA: NEGATIVE mg/dL
Hgb urine dipstick: NEGATIVE
Ketones, ur: NEGATIVE mg/dL
LEUKOCYTES UA: NEGATIVE
Nitrite: NEGATIVE
PROTEIN: NEGATIVE mg/dL
Specific Gravity, Urine: 1.025 (ref 1.005–1.030)
pH: 5.5 (ref 5.0–8.0)

## 2015-12-09 LAB — POCT PREGNANCY, URINE: Preg Test, Ur: NEGATIVE

## 2015-12-09 MED ORDER — HYDROCODONE-ACETAMINOPHEN 7.5-325 MG PO TABS: 1.0000 | ORAL_TABLET | Freq: Four times a day (QID) | ORAL | Status: DC | PRN

## 2015-12-09 NOTE — Telephone Encounter (Signed)
Patient called requesting refill of pain medication.   Discussed patient request for pain medication with Dr. Hulan Fray and she will not give a refill on percocet. Patient states that she will just go to the hospital to be evaluated for her pain. Kathrene Alu RN BSN

## 2015-12-09 NOTE — Discharge Instructions (Signed)
Scar Minimization You will have a scar anytime you have surgery and a cut is made in the skin or you have something removed from your skin (mole, skin cancer, cyst). Although scars are unavoidable following surgery, there are ways to minimize their appearance. It is important to follow all the instructions you receive from your caregiver about wound care. How your wound heals will influence the appearance of your scar. If you do not follow the wound care instructions as directed, complications such as infection may occur. Wound instructions include keeping the wound clean, moist, and not letting the wound form a scab. Some people form scars that are raised and lumpy (hypertrophic) or larger than the initial wound (keloidal). HOME CARE INSTRUCTIONS   Follow wound care instructions as directed.  Keep the wound clean by washing it with soap and water.  Keep the wound moist with provided antibiotic cream or petroleum jelly until completely healed. Moisten twice a day for about 2 weeks.  Get stitches (sutures) taken out at the scheduled time.  Avoid touching or manipulating your wound unless needed. Wash your hands thoroughly before and after touching your wound.  Follow all restrictions such as limits on exercise or work. This depends on where your scar is located.  Keep the scar protected from sunburn. Cover the scar with sunscreen/sunblock with SPF 30 or higher.  Gently massage the scar using a circular motion to help minimize the appearance of the scar. Do this only after the wound has closed and all the sutures have been removed.  For hypertrophic or keloidal scars, there are several ways to treat and minimize their appearance. Methods include compression therapy, intralesional corticosteroids, laser therapy, or surgery. These methods are performed by your caregiver. Remember that the scar may appear lighter or darker than your normal skin color. This difference in color should even out with  time. SEEK MEDICAL CARE IF:   You have a fever.  You develop signs of infection such as pain, redness, pus, and warmth.  You have questions or concerns.   This information is not intended to replace advice given to you by your health care provider. Make sure you discuss any questions you have with your health care provider.   Document Released: 12/30/2009 Document Revised: 10/04/2011 Document Reviewed: 02/12/2015 Elsevier Interactive Patient Education [employer].

## 2015-12-09 NOTE — MAU Provider Note (Signed)
History     CSN: [id number]  Arrival date and time: 12/09/15 1851   None     Chief Complaint  Patient presents with  . Incisional Pain   HPI  Leslie [name] 50 y.o. [id number] status post hysterectomy on 12/02/15. Presents to mau with the complaint of having a "hole" in her incision. States she vomited a lot 2 nights ago and feels like she ripped her incision.  Past Medical History  Diagnosis Date  . Fibroids   . Panic attacks   . Anxiety   . Blood transfusion without reported diagnosis     with miscarriage  . Asthma     uses inhaler, but not asthma  . Depression     denies  . Bipolar disorder ([place])   . Rapid heart beat     with anxiety  . Insomnia   . COPD (chronic obstructive pulmonary disease) ([place])   . GERD (gastroesophageal reflux disease)   . Headache     located back of head daily, this week  . Arthritis     right arm  . Anemia     history of  . Miscarriage   . Cataract     right eye    Past Surgical History  Procedure Laterality Date  . Dilation and curettage of uterus    . Cyst removed       esophagus  . Abdominal hysterectomy  2017    Family History  Problem Relation Age of Onset  . Lung cancer Mother   . Hypertension Mother   . Cancer Mother     lung  . Hypertension Father   . Heart disease Father     Social History  Substance Use Topics  . Smoking status: Current Every Day Smoker -- 1.00 packs/day for 15 years    Types: Cigarettes  . Smokeless tobacco: Never Used  . Alcohol Use: No    Allergies: No Known Allergies  Prescriptions prior to admission  Medication Sig Dispense Refill Last Dose  . albuterol (PROAIR HFA) 108 (90 BASE) MCG/ACT inhaler Inhale 2 puffs into the lungs every 6 (six) hours as needed for wheezing or shortness of breath. 1 Inhaler 0 12/01/2015 at 2000  . ALPRAZolam (XANAX) 0.5 MG tablet Take 0.5 mg by mouth 3 (three) times daily.  0 12/01/2015 at 2000  . ibuprofen (ADVIL,MOTRIN) 800 MG tablet Take 1 tablet (800 mg  total) by mouth every 8 (eight) hours as needed (mild pain). 30 tablet 0   . oxyCODONE-acetaminophen (PERCOCET/ROXICET) 5-325 MG tablet Take 1-2 tablets by mouth every 4 (four) hours as needed for severe pain (moderate to severe pain (when tolerating fluids)). 50 tablet 0   . PAZEO 0.7 % SOLN Place 1 drop into both eyes daily.   3 12/01/2015 at 2000  . RESTASIS 0.05 % ophthalmic emulsion Place 1 drop into both eyes 2 (two) times daily.  3 Past Week at Unknown time  . SYMBICORT 160-4.5 MCG/ACT inhaler Inhale 2 puffs into the lungs 2 (two) times daily.  12 12/01/2015 at 2000  . zolpidem (AMBIEN) 10 MG tablet TAKE 1 TABLET AT BEDTIME  0 11/30/2015    Review of Systems  Constitutional: Negative for fever.  Skin:       Small seperation noted at right side of incision  All other systems reviewed and are negative.  Physical Exam   Blood pressure 119/66, pulse 79, temperature 98 F (36.7 C), temperature source Oral, resp. rate 18, last menstrual period 02/23/2015.  Physical  Exam  Nursing note and vitals reviewed. Constitutional: She is oriented to person, place, and time. She appears well-developed and well-nourished. No distress.  HENT:  Head: Normocephalic and atraumatic.  Cardiovascular: Normal rate and regular rhythm.   Respiratory: Effort normal and breath sounds normal. No respiratory distress.  GI: Soft. Bowel sounds are normal. She exhibits no distension.  Neurological: She is alert and oriented to person, place, and time.  Skin: Skin is warm and dry.  Incision noted with small separation on the right side, no drainage or redness noted.  Psychiatric: She has a normal mood and affect. Her behavior is normal. Judgment and thought content normal.    MAU Course  Procedures  MDM Dr Roselie Awkward consulted to look at incision. Incision wnl. Pt advised to take colace for constipation and given more pain medicine to have postoperatively.   Assessment and Plan  Incision check  Lortab 10/325  #20  Discharge  Clemmons,Lori Grissett 12/09/2015, 7:49 PM

## 2015-12-09 NOTE — MAU Note (Signed)
Pt states today after showering incision she felt a hole near incision and that incision is now burning.  PT denies drainage from incision.

## 2015-12-15 ENCOUNTER — Ambulatory Visit (INDEPENDENT_AMBULATORY_CARE_PROVIDER_SITE_OTHER): Payer: Medicaid Other | Admitting: Obstetrics & Gynecology

## 2015-12-15 ENCOUNTER — Encounter: Payer: Self-pay | Admitting: Obstetrics & Gynecology

## 2015-12-15 VITALS — BP 133/83 | HR 96 | Temp 97.0°F | Resp 16 | Ht 63.0 in | Wt 166.0 lb

## 2015-12-15 DIAGNOSIS — Z9889 Other specified postprocedural states: Secondary | ICD-10-CM

## 2015-12-15 NOTE — Progress Notes (Signed)
   Subjective:    Patient ID: Leslie [name], female    DOB: [birth year], 50 y.o.   MRN: [id number]  HPI Marybelle is here for an ER follow up. She was seen last week at the ER requesting more narcotics. She had a TAH/BS about 2 weeks ago and was sent home with #50 percocet. She was prescribed another #20 last week. Today she says that she is not requesting more narcotics. She is also using the IBU that was prescribed. She reports normal bowel and bladder function.    Review of Systems    Objective:   Physical Exam [id number] Breathing, conversing, and ambulating normally Wearing her pajamas but also makeup Abd- bening, NABS, NT, normal post op tenderness Incision- healing nicely with no sign of infection       Assessment & Plan:  ER- follow up Reassurance given We discussed the possible addiction to narcotics. She believes that this is not an issue. 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 I have strongly encouranged no more narcotics. RTC in 4 weeks/prn sooner

## 2016-01-13 ENCOUNTER — Ambulatory Visit (INDEPENDENT_AMBULATORY_CARE_PROVIDER_SITE_OTHER): Payer: Medicaid Other | Admitting: Obstetrics & Gynecology

## 2016-01-13 ENCOUNTER — Encounter: Payer: Self-pay | Admitting: Obstetrics & Gynecology

## 2016-01-13 VITALS — BP 130/79 | HR 87 | Resp 16 | Ht 63.0 in | Wt 163.0 lb

## 2016-01-13 DIAGNOSIS — R61 Generalized hyperhidrosis: Secondary | ICD-10-CM

## 2016-01-13 MED ORDER — ESTRADIOL 1 MG PO TABS: 1.0000 mg | ORAL_TABLET | Freq: Every day | ORAL | Status: DC

## 2016-01-13 NOTE — Progress Notes (Signed)
   Subjective:    Patient ID: Leslie [name], female    DOB: 02-08-1966, 50 y.o.   MRN: [id number]  HPI  50 yo DW lady is here 6 weeks after a TAH/BS for fibroids   Review of Systems     Objective:   Physical Exam        Assessment & Plan:

## 2016-01-14 LAB — FOLLICLE STIMULATING HORMONE: FSH: 10.2 m[IU]/mL

## 2017-10-05 ENCOUNTER — Telehealth: Payer: Self-pay

## 2017-10-05 NOTE — Telephone Encounter (Signed)
RCVD referral from [place] to Dr. Hulan Fray / [facility] for abdominal pain and abdominal bloating. Called pt [phone] spoke to pt she declined appt at this time. Pt states she is going to GI appt and having an ultrasound. Pt plans to wait for those results then call Dr Hulan Fray to make an appt if needed.

## 2019-11-19 ENCOUNTER — Emergency Department (INDEPENDENT_AMBULATORY_CARE_PROVIDER_SITE_OTHER): Payer: Medicaid Other

## 2019-11-19 ENCOUNTER — Other Ambulatory Visit: Payer: Self-pay

## 2019-11-19 ENCOUNTER — Emergency Department (INDEPENDENT_AMBULATORY_CARE_PROVIDER_SITE_OTHER)
Admission: EM | Admit: 2019-11-19 | Discharge: 2019-11-19 | Disposition: A | Discharge status: Home / Self Care | Payer: Medicaid Other | Source: Home / Self Care

## 2019-11-19 DIAGNOSIS — J4521 Mild intermittent asthma with (acute) exacerbation: Secondary | ICD-10-CM

## 2019-11-19 MED ORDER — PREDNISONE 50 MG PO TABS: 50.0000 mg | ORAL_TABLET | Freq: Every day | ORAL | 0 refills | Status: AC

## 2019-11-19 MED ORDER — CETIRIZINE HCL 10 MG PO TABS: 10.0000 mg | ORAL_TABLET | Freq: Every day | ORAL | 0 refills | Status: AC

## 2019-11-19 MED ORDER — METHYLPREDNISOLONE SODIUM SUCC 40 MG IJ SOLR
80.0000 mg | Freq: Once | INTRAMUSCULAR | Status: AC
  Administered 2019-11-19: 09:00:00 80 mg via INTRAMUSCULAR

## 2019-11-19 NOTE — Discharge Instructions (Signed)
  You were given a shot of solumedrol (a steroid) today to help with inflammation in your lungs to help you breath better and to help with your cough.  You have been prescribed 3 days of prednisone, an oral steroid.  You may start this medication tomorrow with breakfast.    Please call to schedule a follow up appointment with your family doctor and lung specialist for further evaluation and treatment of your recurrent symptoms.  Call 911 or have someone drive you to the closest hospital if symptoms worsening- trouble breathing, chest pain, dizziness, passing out or other new concerning symptoms develop.
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
## 2019-11-19 NOTE — ED Triage Notes (Signed)
Patient presents to Urgent Care with complaints of shortness of breath since about 3 days ago. Patient reports she is a smoker, has been using her inhalers recently but not more frequently than normal. Pt also thinks her blood pressure is elevated.

## 2020-01-21 ENCOUNTER — Encounter: Payer: Self-pay | Admitting: Obstetrics & Gynecology

## 2020-01-21 ENCOUNTER — Other Ambulatory Visit (HOSPITAL_COMMUNITY)
Admission: RE | Admit: 2020-01-21 | Discharge: 2020-01-21 | Disposition: A | Discharge status: Home / Self Care | Payer: Medicaid Other | Source: Ambulatory Visit | Attending: Obstetrics & Gynecology | Admitting: Obstetrics & Gynecology

## 2020-01-21 ENCOUNTER — Ambulatory Visit: Payer: Medicaid Other | Admitting: Obstetrics & Gynecology

## 2020-01-21 ENCOUNTER — Other Ambulatory Visit: Payer: Self-pay

## 2020-01-21 VITALS — BP 136/87 | HR 97 | Ht 63.0 in | Wt 170.0 lb

## 2020-01-21 DIAGNOSIS — N898 Other specified noninflammatory disorders of vagina: Secondary | ICD-10-CM

## 2020-01-21 DIAGNOSIS — Z01419 Encounter for gynecological examination (general) (routine) without abnormal findings: Secondary | ICD-10-CM

## 2020-01-21 DIAGNOSIS — N951 Menopausal and female climacteric states: Secondary | ICD-10-CM | POA: Diagnosis not present

## 2020-01-21 DIAGNOSIS — Z0000 Encounter for general adult medical examination without abnormal findings: Secondary | ICD-10-CM

## 2020-01-21 MED ORDER — ESTRADIOL 0.025 MG/24HR TD PTTW: 1.0000 | MEDICATED_PATCH | TRANSDERMAL | 12 refills | Status: DC

## 2020-01-21 NOTE — Addendum Note (Signed)
Addended by: Asencion Islam on: 01/21/2020 01:40 PM   Modules accepted: Orders

## 2020-01-21 NOTE — Addendum Note (Signed)
Addended by: Lyndal Rainbow on: 01/21/2020 12:07 PM   Modules accepted: Orders

## 2020-01-21 NOTE — Progress Notes (Signed)
Subjective:     Leslie [name] is a 54 y.o. female here for a routine exam.  Current complaints: vaginal dryness, hot flashes requesting medication, hirsutism and decreased libido..   Gynecologic History Patient's last menstrual period was 02/23/2015. Contraception: status post hysterectomy Last Pap: n/a--hysterectomy for benign condition and path nml for cervix..  Last mammogram: never--ordered today Colonoscopy--nml per patient  Obstetric History OB History  Gravida Para Term Preterm AB Living  6 3 3  0 3 3  SAB TAB Ectopic Multiple Live Births  3   0 0      # Outcome Date GA Lbr Len/2nd Weight Sex Delivery Anes PTL Lv  6 SAB           5 SAB           4 SAB           3 Term      Vag-Spont     2 Term      Vag-Spont     1 Term    8 lb (3.629 kg)  Vag-Spont        The following portions of the patient's history were reviewed and updated as appropriate: allergies, current medications, past family history, past medical history, past social history, past surgical history and problem list.  Review of Systems Pertinent items noted in HPI and remainder of comprehensive ROS otherwise negative.    Objective:      Vitals:   01/21/20 1047  BP: 136/87  Pulse: 97  Weight: 170 lb (77.1 kg)  Height: 5\' 3"  (1.6 m)   Vitals:  WNL General appearance: alert, cooperative and no distress  HEENT: Normocephalic, without obvious abnormality, atraumatic Eyes: negative Throat: lips, mucosa, and tongue normal; teeth and gums normal  Respiratory: Clear to auscultation bilaterally  CV: Regular rate and rhythm  Breasts:  Normal appearance, no masses or tenderness, no nipple retraction or dimpling  GI: Soft, non-tender; bowel sounds normal; no masses,  no organomegaly  GU: External Genitalia:  Tanner V, no lesion Urethra:  No prolapse   Vagina: Pink, normal rugae, no blood or discharge  Cervix: No CMT, no lesion  Uterus:  Surgically absent  Adnexa: Surgically absent  Musculoskeletal: No  edema, redness or tenderness in the calves or thighs  Skin: No lesions or rash, hair on lower abdomen dn chin  Lymphatic: Axillary adenopathy: none     Psychiatric: Normal mood and behavior       Assessment:    Healthy female exam.   Menopausal symptoms Vaginal dryness   Plan:   Pap not indicated as above Mammogram  Menopausal symptoms:  Reviewed HRT and no hormonal treatments for menopausal symptoms  Patient with bothersome menopausal vasomotor symptoms. Discussed lifestyle interventions such as wearing light clothing, remaining in cool environments, having fan/air conditioner in the room, avoiding hot beverages etc.  Discussed using hormone therapy and concerns about increased risk of heart disease, cerebrovascular disease, thromboembolic disease,  and breast cancer.  Also discussed other medical options such as Paxil, Effexor or Neurontin.   Also discussed alternative therapies such as herbal remedies but cautioned that most of the products contained phytoestrogens (plant estrogens) in unregulated amounts which can have the same effects on the body as the pharmaceutical estrogen preparations.  Also referred her to www.menopause.org for other alternative options.  Patient opted for Vivelle patch estrogen therapy for now.  Progesterone not indicated secondary to hysterectomy.  She will return in 3 months for reevaluation.  Vaginal dryness--aptima to r/o  vaginitis; Replens for dryness (HRT should also help).  Astroglide for intercourse.

## 2020-01-21 NOTE — Patient Instructions (Signed)
Replens--over the counter   Menopause Menopause is the normal time of life when menstrual periods stop completely. It is usually confirmed by 12 months without a menstrual period. The transition to menopause (perimenopause) most often happens between the ages of 65 and 29. During perimenopause, hormone levels change in your body, which can cause symptoms and affect your health. Menopause may increase your risk for:  Loss of bone (osteoporosis), which causes bone breaks (fractures).  Depression.  Hardening and narrowing of the arteries (atherosclerosis), which can cause heart attacks and strokes. What are the causes? This condition is usually caused by a natural change in hormone levels that happens as you get older. The condition may also be caused by surgery to remove both ovaries (bilateral oophorectomy). What increases the risk? This condition is more likely to start at an earlier age if you have certain medical conditions or treatments, including:  A tumor of the pituitary gland in the brain.  A disease that affects the ovaries and hormone production.  Radiation treatment for cancer.  Certain cancer treatments, such as chemotherapy or hormone (anti-estrogen) therapy.  Heavy smoking and excessive alcohol use.  Family history of early menopause. This condition is also more likely to develop earlier in women who are very thin. What are the signs or symptoms? Symptoms of this condition include:  Hot flashes.  Irregular menstrual periods.  Night sweats.  Changes in feelings about sex. This could be a decrease in sex drive or an increased comfort around your sexuality.  Vaginal dryness and thinning of the vaginal walls. This may cause painful intercourse.  Dryness of the skin and development of wrinkles.  Headaches.  Problems sleeping (insomnia).  Mood swings or irritability.  Memory problems.  Weight gain.  Hair growth on the face and chest.  Bladder infections  or problems with urinating. How is this diagnosed? This condition is diagnosed based on your medical history, a physical exam, your age, your menstrual history, and your symptoms. Hormone tests may also be done. How is this treated? In some cases, no treatment is needed. You and your health care provider should make a decision together about whether treatment is necessary. Treatment will be based on your individual condition and preferences. Treatment for this condition focuses on managing symptoms. Treatment may include:  Menopausal hormone therapy (MHT).  Medicines to treat specific symptoms or complications.  Acupuncture.  Vitamin or herbal supplements. Before starting treatment, make sure to let your health care provider know if you have a personal or family history of:  Heart disease.  Breast cancer.  Blood clots.  Diabetes.  Osteoporosis. Follow these instructions at home: Lifestyle  Do not use any products that contain nicotine or tobacco, such as cigarettes and e-cigarettes. If you need help quitting, ask your health care provider.  Get at least 30 minutes of physical activity on 5 or more days each week.  Avoid alcoholic and caffeinated beverages, as well as spicy foods. This may help prevent hot flashes.  Get 7-8 hours of sleep each night.  If you have hot flashes, try: ? Dressing in layers. ? Avoiding things that may trigger hot flashes, such as spicy food, warm places, or stress. ? Taking slow, deep breaths when a hot flash starts. ? Keeping a fan in your home and office.  Find ways to manage stress, such as deep breathing, meditation, or journaling.  Consider going to group therapy with other women who are having menopause symptoms. Ask your health care provider about recommended  group therapy meetings. Eating and drinking  Eat a healthy, balanced diet that contains whole grains, lean protein, low-fat dairy, and plenty of fruits and vegetables.  Your  health care provider may recommend adding more soy to your diet. Foods that contain soy include tofu, tempeh, and soy milk.  Eat plenty of foods that contain calcium and vitamin D for bone health. Items that are rich in calcium include low-fat milk, yogurt, beans, almonds, sardines, broccoli, and kale. Medicines  Take over-the-counter and prescription medicines only as told by your health care provider.  Talk with your health care provider before starting any herbal supplements. If prescribed, take vitamins and supplements as told by your health care provider. These may include: ? Calcium. Women age 66 and older should get 1,200 mg (milligrams) of calcium every day. ? Vitamin D. Women need 600-800 International Units of vitamin D each day. ? Vitamins B12 and B6. Aim for 50 micrograms of B12 and 1.5 mg of B6 each day. General instructions  Keep track of your menstrual periods, including: ? When they occur. ? How heavy they are and how long they last. ? How much time passes between periods.  Keep track of your symptoms, noting when they start, how often you have them, and how long they last.  Use vaginal lubricants or moisturizers to help with vaginal dryness and improve comfort during sex.  Keep all follow-up visits as told by your health care provider. This is important. This includes any group therapy or counseling. Contact a health care provider if:  You are still having menstrual periods after age 84.  You have pain during sex.  You have not had a period for 12 months and you develop vaginal bleeding. Get help right away if:  You have: ? Severe depression. ? Excessive vaginal bleeding. ? Pain when you urinate. ? A fast or irregular heart beat (palpitations). ? Severe headaches. ? Abdomen (abdominal) pain or severe indigestion.  You fell and you think you have a broken bone.  You develop leg or chest pain.  You develop vision problems.  You feel a lump in your  breast. Summary  Menopause is the normal time of life when menstrual periods stop completely. It is usually confirmed by 12 months without a menstrual period.  The transition to menopause (perimenopause) most often happens between the ages of 39 and 23.  Symptoms can be managed through medicines, lifestyle changes, and complementary therapies such as acupuncture.  Eat a balanced diet that is rich in nutrients to promote bone health and heart health and to manage symptoms during menopause. This information is not intended to replace advice given to you by your health care provider. Make sure you discuss any questions you have with your health care provider. Document Revised: 06/24/2017 Document Reviewed: 08/14/2016 Elsevier Patient Education  2020 [employer].

## 2020-01-21 NOTE — Progress Notes (Signed)
Pt c/o vaginal dryness, facial hair, mood swings and hot flashes

## 2020-01-22 LAB — CERVICOVAGINAL ANCILLARY ONLY
Bacterial Vaginitis (gardnerella): NEGATIVE
Candida Glabrata: NEGATIVE
Candida Vaginitis: NEGATIVE
Comment: NEGATIVE
Comment: NEGATIVE
Comment: NEGATIVE

## 2020-01-24 ENCOUNTER — Ambulatory Visit (INDEPENDENT_AMBULATORY_CARE_PROVIDER_SITE_OTHER): Payer: Medicaid Other

## 2020-01-24 ENCOUNTER — Other Ambulatory Visit: Payer: Self-pay

## 2020-01-24 DIAGNOSIS — Z1231 Encounter for screening mammogram for malignant neoplasm of breast: Secondary | ICD-10-CM

## 2020-01-24 DIAGNOSIS — Z01419 Encounter for gynecological examination (general) (routine) without abnormal findings: Secondary | ICD-10-CM

## 2020-03-12 ENCOUNTER — Telehealth: Payer: Self-pay | Admitting: *Deleted

## 2020-03-12 NOTE — Telephone Encounter (Signed)
Returned call from 1:08 PM. Patient does not want to schedule F/U appointment at this time. Patient states that medication did not work for her. Spoke with patient about scheduling the F/U to discuss with Dr. Gala Romney, but she states that she will call back if she wants to schedule.

## 2020-03-12 NOTE — Telephone Encounter (Signed)
Patient was at a doctor's appointment, will call back.

## 2020-06-16 ENCOUNTER — Ambulatory Visit: Payer: Medicaid Other | Admitting: Obstetrics & Gynecology

## 2021-05-18 ENCOUNTER — Ambulatory Visit (INDEPENDENT_AMBULATORY_CARE_PROVIDER_SITE_OTHER): Payer: Medicaid Other | Admitting: Obstetrics and Gynecology

## 2021-05-18 ENCOUNTER — Encounter: Payer: Self-pay | Admitting: Obstetrics and Gynecology

## 2021-05-18 ENCOUNTER — Other Ambulatory Visit: Payer: Self-pay

## 2021-05-18 VITALS — BP 140/88 | HR 84 | Ht 65.0 in | Wt 159.0 lb

## 2021-05-18 DIAGNOSIS — N898 Other specified noninflammatory disorders of vagina: Secondary | ICD-10-CM

## 2021-05-18 DIAGNOSIS — Z01419 Encounter for gynecological examination (general) (routine) without abnormal findings: Secondary | ICD-10-CM

## 2021-05-18 MED ORDER — ESTRADIOL 0.025 MG/24HR TD PTTW: 1.0000 | MEDICATED_PATCH | TRANSDERMAL | 12 refills | Status: AC

## 2021-05-18 NOTE — Progress Notes (Signed)
Subjective:     Leslie [name] is a 55 y.o. female P3 with BMI 26 postmenopausal who is here for a comprehensive physical exam. The patient reports no problems. Patient reports feeling well and denies urinary incontinence. She reports painful intercourse yesterday. This was the first time she had been sexually active in a while and admits to vaginal dryness. She failed to use vaginal lubricants. Patient also stopped taking her estradiol over 6 months ago and reports more vaginal dryness and has been told that she is "angrier". She denies hot flashes or night sweat. Patient is without any other complaints  Past Medical History:  Diagnosis Date   Anemia    history of   Anxiety    Arthritis    right arm   Asthma    uses inhaler, but not asthma   Bipolar disorder ([place])    Blood transfusion without reported diagnosis    with miscarriage   Cataract    right eye   COPD (chronic obstructive pulmonary disease) (HCC)    Depression    denies   Fibroids    GERD (gastroesophageal reflux disease)    Headache    located back of head daily, this week   Insomnia    Miscarriage    Panic attacks    Rapid heart beat    with anxiety   Past Surgical History:  Procedure Laterality Date   ABDOMINAL HYSTERECTOMY  2017   cyst removed      esophagus   DILATION AND CURETTAGE OF UTERUS     Family History  Problem Relation Age of Onset   Lung cancer Mother    Hypertension Mother    Cancer Mother        lung   Hypertension Father    Heart disease Father      Social History   Socioeconomic History   Marital status: Divorced    Spouse name: Not on file   Number of children: Not on file   Years of education: Not on file   Highest education level: Not on file  Occupational History   Not on file  Tobacco Use   Smoking status: Every Day    Packs/day: 1.00    Years: 15.00    Pack years: 15.00    Types: Cigarettes   Smokeless tobacco: Never  Vaping Use   Vaping Use: Never used  Substance  and Sexual Activity   Alcohol use: Yes    Alcohol/week: 6.0 standard drinks    Types: 6 Shots of liquor per week   Drug use: No   Sexual activity: Yes    Partners: Male    Birth control/protection: None, Surgical    Comment: past week  Other Topics Concern   Not on file  Social History Narrative   Not on file   Social Determinants of Health   Financial Resource Strain: Not on file  Food Insecurity: Not on file  Transportation Needs: Not on file  Physical Activity: Not on file  Stress: Not on file  Social Connections: Not on file  Intimate Partner Violence: Not on file   Health Maintenance  Topic Date Due   Pneumococcal Vaccine 67-68 Years old (1 - PCV) Never done   HIV Screening  Never done   Hepatitis C Screening  Never done   TETANUS/TDAP  Never done   COLONOSCOPY (Pts 45-76yrs Insurance coverage will need to be confirmed)  Never done   Zoster Vaccines- Shingrix (1 of 2) Never done   COVID-19  Vaccine (2 - Pfizer series) 04/23/2020   INFLUENZA VACCINE  Never done   MAMMOGRAM  01/23/2022   HPV VACCINES  Aged Out   PAP SMEAR-Modifier  Discontinued       Review of Systems Pertinent items noted in HPI and remainder of comprehensive ROS otherwise negative.   Objective:    GENERAL: Well-developed, well-nourished female in no acute distress.  HEENT: Normocephalic, atraumatic. Sclerae anicteric.  NECK: Supple. Normal thyroid.  LUNGS: Clear to auscultation bilaterally.  HEART: Regular rate and rhythm. BREASTS: Symmetric in size. No palpable masses or lymphadenopathy, skin changes, or nipple drainage. ABDOMEN: Soft, nontender, nondistended. No organomegaly. PELVIC: Normal external female genitalia. Vagina is pink and rugated.  Normal discharge. Vaginal cuff intact. No adnexal mass or tenderness. Chaperone present during the pelvic exam EXTREMITIES: No cyanosis, clubbing, or edema, 2+ distal pulses.     Assessment:    Healthy female exam.      Plan:    Pap smear  no longer indicated Screening mammogram ordered Refill on estradiol provided with instructions on taking the lowest dose possible for the least amount of time possible. Patient verbalized understanding Discussed the use of water-based vaginal lubricant with intercourse See After Visit Summary for Counseling Recommendations

## 2021-05-18 NOTE — Progress Notes (Signed)
Pt presents today for yearly exam. LMP/BCM: Hyst MMG: 01/2020. Pt states she needs RF of estradiol. Also patient c/o vaginal dryness.

## 2021-12-22 ENCOUNTER — Encounter (HOSPITAL_COMMUNITY): Payer: Self-pay

## 2021-12-22 ENCOUNTER — Emergency Department (HOSPITAL_COMMUNITY): Payer: Medicaid Other

## 2021-12-22 ENCOUNTER — Other Ambulatory Visit: Payer: Self-pay

## 2021-12-22 ENCOUNTER — Emergency Department (HOSPITAL_COMMUNITY)
Admission: EM | Admit: 2021-12-22 | Discharge: 2021-12-22 | Disposition: A | Discharge status: Home / Self Care | Payer: Medicaid Other | Attending: Emergency Medicine | Admitting: Emergency Medicine

## 2021-12-22 DIAGNOSIS — R0602 Shortness of breath: Secondary | ICD-10-CM | POA: Insufficient documentation

## 2021-12-22 DIAGNOSIS — F10129 Alcohol abuse with intoxication, unspecified: Secondary | ICD-10-CM | POA: Insufficient documentation

## 2021-12-22 DIAGNOSIS — Y908 Blood alcohol level of 240 mg/100 ml or more: Secondary | ICD-10-CM | POA: Insufficient documentation

## 2021-12-22 DIAGNOSIS — Z20822 Contact with and (suspected) exposure to covid-19: Secondary | ICD-10-CM | POA: Insufficient documentation

## 2021-12-22 DIAGNOSIS — F17210 Nicotine dependence, cigarettes, uncomplicated: Secondary | ICD-10-CM | POA: Insufficient documentation

## 2021-12-22 DIAGNOSIS — Z79899 Other long term (current) drug therapy: Secondary | ICD-10-CM | POA: Insufficient documentation

## 2021-12-22 DIAGNOSIS — Z7951 Long term (current) use of inhaled steroids: Secondary | ICD-10-CM | POA: Diagnosis not present

## 2021-12-22 DIAGNOSIS — J441 Chronic obstructive pulmonary disease with (acute) exacerbation: Secondary | ICD-10-CM | POA: Diagnosis not present

## 2021-12-22 DIAGNOSIS — F10920 Alcohol use, unspecified with intoxication, uncomplicated: Secondary | ICD-10-CM

## 2021-12-22 DIAGNOSIS — R072 Precordial pain: Secondary | ICD-10-CM | POA: Diagnosis present

## 2021-12-22 LAB — BASIC METABOLIC PANEL WITH GFR
Anion gap: 18 — ABNORMAL HIGH (ref 5–15)
BUN: 5 mg/dL — ABNORMAL LOW (ref 6–20)
CO2: 21 mmol/L — ABNORMAL LOW (ref 22–32)
Calcium: 8.9 mg/dL (ref 8.9–10.3)
Chloride: 92 mmol/L — ABNORMAL LOW (ref 98–111)
Creatinine, Ser: 0.39 mg/dL — ABNORMAL LOW (ref 0.44–1.00)
GFR, Estimated: >60 mL/min (ref >60)
Glucose, Bld: 76 mg/dL (ref 70–99)
Potassium: 3.5 mmol/L (ref 3.5–5.1)
Sodium: 131 mmol/L — ABNORMAL LOW (ref 135–145)

## 2021-12-22 LAB — CBC
HCT: 49.6 % — ABNORMAL HIGH (ref 36.0–46.0)
Hemoglobin: 16.6 g/dL — ABNORMAL HIGH (ref 12.0–15.0)
MCH: 30.7 pg (ref 26.0–34.0)
MCHC: 33.5 g/dL (ref 30.0–36.0)
MCV: 91.9 fL (ref 80.0–100.0)
Platelets: 227 10*3/uL (ref 150–400)
RBC: 5.4 MIL/uL — ABNORMAL HIGH (ref 3.87–5.11)
RDW: 17.7 % — ABNORMAL HIGH (ref 11.5–15.5)
WBC: 7.6 10*3/uL (ref 4.0–10.5)
nRBC: 0 % (ref 0.0–0.2)

## 2021-12-22 LAB — RESP PANEL BY RT-PCR (FLU A&B, COVID) ARPGX2
Influenza A by PCR: NEGATIVE
Influenza B by PCR: NEGATIVE
SARS Coronavirus 2 by RT PCR: NEGATIVE

## 2021-12-22 LAB — TROPONIN I (HIGH SENSITIVITY)
Troponin I (High Sensitivity): 5 ng/L (ref <18)
Troponin I (High Sensitivity): 5 ng/L (ref <18)

## 2021-12-22 LAB — ETHANOL: Alcohol, Ethyl (B): 323 mg/dL (ref <10)

## 2021-12-22 LAB — CBG MONITORING, ED: Glucose-Capillary: 74 mg/dL (ref 70–99)

## 2021-12-22 LAB — I-STAT BETA HCG BLOOD, ED (MC, WL, AP ONLY): I-stat hCG, quantitative: <5 m[IU]/mL (ref <5)

## 2021-12-22 MED ORDER — METHYLPREDNISOLONE SODIUM SUCC 125 MG IJ SOLR
125.0000 mg | Freq: Once | INTRAMUSCULAR | Status: AC
  Administered 2021-12-22: 125 mg via INTRAVENOUS
  Filled 2021-12-22: qty 2

## 2021-12-22 MED ORDER — IPRATROPIUM-ALBUTEROL 0.5-2.5 (3) MG/3ML IN SOLN
3.0000 mL | Freq: Once | RESPIRATORY_TRACT | Status: AC
  Administered 2021-12-22: 3 mL via RESPIRATORY_TRACT
  Filled 2021-12-22: qty 3

## 2021-12-22 MED ORDER — PREDNISONE 20 MG PO TABS: 20.0000 mg | ORAL_TABLET | Freq: Every day | ORAL | 0 refills | Status: AC

## 2021-12-22 MED ORDER — AMOXICILLIN-POT CLAVULANATE 875-125 MG PO TABS: 1.0000 | ORAL_TABLET | Freq: Two times a day (BID) | ORAL | 0 refills | Status: AC

## 2021-12-22 MED ORDER — IOHEXOL 350 MG/ML SOLN
80.0000 mL | Freq: Once | INTRAVENOUS | Status: AC | PRN
  Administered 2021-12-22: 80 mL via INTRAVENOUS

## 2021-12-22 MED ORDER — ALPRAZOLAM 0.25 MG PO TABS
0.5000 mg | ORAL_TABLET | Freq: Once | ORAL | Status: AC
  Administered 2021-12-22: 0.5 mg via ORAL
  Filled 2021-12-22: qty 2

## 2021-12-22 NOTE — Discharge Instructions (Addendum)
Please pick up  medications and take as prescribed. Follow up with your pulmonologist for further evaluation of your ED visit today. Continue using your inhalers as indicated. You can take Ibuprofen and Tylenol as needed for pain.   Your CT scan did show an area near your esophagus that may represent a cyst of some kind. Follow up with your PCP for further eval as you may need an MRI in the near future for better visualization.   Return to the ED for any new/worsening symptoms

## 2021-12-22 NOTE — ED Provider Triage Note (Signed)
Emergency Medicine Provider Triage Evaluation Note  Natayla Cadenhead , a 56 y.o. female  was evaluated in triage.  Pt complains of chest pain and palpitations.  Patient was roomed before the chance to evaluate her.  Hx of alcohol dependence, COPD, benzodiazepine dependence, anxiety disorder, tobacco use, panic attacks, depression, bipolar disorder.  Review of Systems  Positive:  Negative:   Physical Exam  BP 128/83 (BP Location: Right Arm)   Pulse (!) 108   Temp 97.9 F (36.6 C) (Oral)   Resp (!) 24   Ht '5\' 5"'$  (1.651 m)   Wt 67.6 kg   LMP 02/23/2015   SpO2 92%   BMI 24.79 kg/m    Medical Decision Making  Medically screening exam initiated at 6:21 PM.  Appropriate orders placed.  Aurora Rody was informed that the remainder of the evaluation will be completed by another provider, this initial triage assessment does not replace that evaluation, and the importance of remaining in the ED until their evaluation is complete.  Patient was roomed for the chance to evaluate her   Candace Cruise 33/35/45 [id number]

## 2021-12-22 NOTE — ED Provider Notes (Signed)
[facility] EMERGENCY DEPARTMENT Provider Note   CSN: [id number] Arrival date & time: 12/22/21  1514     History  Chief Complaint  Patient presents with   Chest Pain    Leslie [name] is a 56 y.o. female with PMHx COPD who presents to the ED today via EMS with complaint of substernal chest pain that began this morning. Pt reports that 3 days ago she fell and landed onto her back; no head injury or LOC. She reports she had been drinking for the pain however today began experiencing chest pain and SOB. She also complains of productive cough and subjective fevers/chills. She denies any recent sick contacts. With EMS pt was found to be hypoxic with O2 sats at 83% on RA; placed on 3L [place]. She is not typically on oxygen at home. She was provided with 324 mg ASA, 0.4 mg NTG, and a duoneb treatment en route without much improvement. PT denies hx of COPD despite it being in her PMHx. She continues to smoke 0.5 ppd. No hx DVT/PE however does complaint of pleuritic pain.   The history is provided by the patient, medical records and the EMS personnel.      Home Medications Prior to Admission medications   Medication Sig Start Date End Date Taking? Authorizing Provider  amoxicillin-clavulanate (AUGMENTIN) 875-125 MG tablet Take 1 tablet by mouth every 12 (twelve) hours. 12/22/21  Yes Jahziel Sinn, PA-C  mupirocin ointment (BACTROBAN) 2 % Apply 1 application. topically 2 (two) times daily. 11/24/21  Yes [provider]  omeprazole (PRILOSEC) 40 MG capsule Take 40 mg by mouth daily. 11/24/21  Yes [provider]  predniSONE (DELTASONE) 20 MG tablet Take 1 tablet (20 mg total) by mouth daily for 5 days. 12/22/21 12/27/21 Yes Eldwin Volkov, PA-C  RESTASIS 0.05 % ophthalmic emulsion Place 1 drop into both eyes 2 (two) times daily.  10/03/15  Yes [provider]  SYMBICORT 160-4.5 MCG/ACT inhaler Inhale 2 puffs into the lungs 2 (two) times daily.  09/22/15  Yes  [provider]  vitamin C (ASCORBIC ACID) 500 MG tablet Take 500 mg by mouth daily.   Yes [provider]  zolpidem (AMBIEN) 10 MG tablet Take 10 mg by mouth at bedtime as needed for sleep. 11/28/21  Yes [provider]  albuterol (PROAIR HFA) 108 (90 BASE) MCG/ACT inhaler Inhale 2 puffs into the lungs every 6 (six) hours as needed for wheezing or shortness of breath. Patient not taking: Reported on 12/22/2021 02/06/14   Delos Haring, PA-C  ALPRAZolam Duanne Moron) 1 MG tablet Take 0.5-1 mg by mouth 3 (three) times daily. 11/28/21   [provider]  ARIPiprazole (ABILIFY) 2 MG tablet Take 2 mg by mouth daily. 12/22/21   [provider]  cetirizine (ZYRTEC) 10 MG tablet Take 1 tablet (10 mg total) by mouth daily. Patient not taking: Reported on 12/22/2021 11/19/19   Noe Gens, PA-C  estradiol (VIVELLE-DOT) 0.025 MG/24HR Place 1 patch onto the skin 2 (two) times a week. Patient not taking: Reported on 12/22/2021 05/18/21   Constant, Peggy, MD  ibuprofen (ADVIL,MOTRIN) 800 MG tablet Take 1 tablet (800 mg total) by mouth every 8 (eight) hours as needed (mild pain). Patient not taking: Reported on 05/18/2021 12/04/15   Emily Filbert, MD  medroxyPROGESTERone (DEPO-PROVERA) 150 MG/ML injection Inject into the muscle.  01/21/15  [provider]      Allergies    Patient has no known allergies.    Review  of Systems   Review of Systems  Constitutional:  Positive for chills and fever (subjective).  Respiratory:  Positive for cough and shortness of breath.   Cardiovascular:  Positive for chest pain.  Gastrointestinal:  Negative for nausea and vomiting.  All other systems reviewed and are negative.  Physical Exam Updated Vital Signs BP 116/63   Pulse 87   Temp 97.9 F (36.6 C) (Oral)   Resp 20   Ht '5\' 5"'$  (1.651 m)   Wt 67.6 kg   LMP 02/23/2015   SpO2 92%   BMI 24.79 kg/m  Physical Exam Vitals and nursing note reviewed.  Constitutional:       Appearance: She is not ill-appearing or diaphoretic.  HENT:     Head: Normocephalic and atraumatic.  Eyes:     Conjunctiva/sclera: Conjunctivae normal.  Cardiovascular:     Rate and Rhythm: Normal rate and regular rhythm.     Pulses:          Radial pulses are 2+ on the right side and 2+ on the left side.     Heart sounds: Normal heart sounds.  Pulmonary:     Effort: Pulmonary effort is normal.     Breath sounds: Wheezing present. No decreased breath sounds, rhonchi or rales.     Comments: Actively coughing. Currently on 3L [place] satting 95% Chest:     Chest wall: No tenderness.  Abdominal:     Palpations: Abdomen is soft.     Tenderness: There is no abdominal tenderness.  Musculoskeletal:     Cervical back: Neck supple.     Right lower leg: No edema.     Left lower leg: No edema.  Skin:    General: Skin is warm and dry.  Neurological:     Mental Status: She is alert.    ED Results / Procedures / Treatments   Labs (all labs ordered are listed, but only abnormal results are displayed) Labs Reviewed  BASIC METABOLIC PANEL - Abnormal; Notable for the following components:      Result Value   Sodium 131 (*)    Chloride 92 (*)    CO2 21 (*)    BUN 5 (*)    Creatinine, Ser 0.39 (*)    Anion gap 18 (*)    All other components within normal limits  CBC - Abnormal; Notable for the following components:   RBC 5.40 (*)    Hemoglobin 16.6 (*)    HCT 49.6 (*)    RDW 17.7 (*)    All other components within normal limits  ETHANOL - Abnormal; Notable for the following components:   Alcohol, Ethyl (B) 323 (*)    All other components within normal limits  RESP PANEL BY RT-PCR (FLU A&B, COVID) ARPGX2  I-STAT BETA HCG BLOOD, ED ([facility], [facility], [facility] ONLY)  CBG MONITORING, ED  TROPONIN I (HIGH SENSITIVITY)  TROPONIN I (HIGH SENSITIVITY)    EKG None  Radiology DG Chest 2 View  Result Date: 12/22/2021 CLINICAL DATA:  Chest pain. EXAM: CHEST - 2 VIEW COMPARISON:  November 19, 2019. FINDINGS:  The heart size and mediastinal contours are within normal limits. Minimal right middle lobe subsegmental atelectasis is noted. Left lung is clear. The visualized skeletal structures are unremarkable. IMPRESSION: Minimal right middle lobe subsegmental atelectasis. Electronically Signed   By: Marijo Conception M.D.   On: 12/22/2021 16:04   CT Angio Chest PE W/Cm &/Or Wo Cm  Result Date: 12/22/2021 CLINICAL DATA:  Chest pain and  palpitations. EXAM: CT ANGIOGRAPHY CHEST WITH CONTRAST TECHNIQUE: Multidetector CT imaging of the chest was performed using the standard protocol during bolus administration of intravenous contrast. Multiplanar CT image reconstructions and MIPs were obtained to evaluate the vascular anatomy. RADIATION DOSE REDUCTION: This exam was performed according to the departmental dose-optimization program which includes automated exposure control, adjustment of the mA and/or kV according to patient size and/or use of iterative reconstruction technique. CONTRAST:  6m OMNIPAQUE IOHEXOL 350 MG/ML SOLN COMPARISON:  Chest x-ray same day. FINDINGS: Cardiovascular: Satisfactory opacification of the pulmonary arteries to the segmental level. No evidence of pulmonary embolism. Normal heart size. No pericardial effusion. Mediastinum/Nodes: There is a well-circumscribed low-attenuation area in the posterior mid mediastinum adjacent to the left side of the esophagus measuring 3.0 x 3.4 x 3.3 cm. There is mild esophageal deviation to the right. Esophagus is otherwise within normal limits. There is no surrounding inflammation. There is no pneumomediastinum. No other enlarged lymph nodes are identified. Visualized thyroid gland is within normal limits. Lungs/Pleura: There is a band of atelectasis or scarring in the right middle lobe and lingula. The lungs are otherwise clear. No pleural effusion or pneumothorax. Trachea and central airways are patent. Upper Abdomen: No acute abnormality. Musculoskeletal: There are  healed right sixth seventh and eighth rib fractures. No acute fractures are seen. Review of the MIP images confirms the above findings. IMPRESSION: 1. No evidence for pulmonary embolism. 2. 3.4 cm rounded low-attenuation area in the posterior mediastinum adjacent to the esophagus, indeterminate. Findings may represent a bronchogenic cyst. Differential diagnosis would include pericardial cyst, lymphangioma, esophageal duplication cyst. This can be further evaluated with MRI as clinically warranted. 3. Atelectasis in the right middle lobe and lingula. Electronically Signed   By: ARonney AstersM.D.   On: 12/22/2021 22:57    Procedures Procedures    Medications Ordered in ED Medications  methylPREDNISolone sodium succinate (SOLU-MEDROL) 125 mg/2 mL injection 125 mg (125 mg Intravenous Given 12/22/21 1953)  ipratropium-albuterol (DUONEB) 0.5-2.5 (3) MG/3ML nebulizer solution 3 mL (3 mLs Nebulization Given 12/22/21 1945)  ALPRAZolam (XANAX) tablet 0.5 mg (0.5 mg Oral Given 12/22/21 2052)  iohexol (OMNIPAQUE) 350 MG/ML injection 80 mL (80 mLs Intravenous Contrast Given 12/22/21 2236)    ED Course/ Medical Decision Making/ A&P                           Medical Decision Making 56year old female who presents to the ED today with complaint of chest pain, shortness of breath, subjective fever/chills, productive cough for the past few days.  Found to be hypoxic with EMS with O2 sats 83%.  She was provided aspirin, nitroglycerin, breathing treatment.  History of COPD and is a current everyday smoker.  On arrival to the ED remainder vitals are unremarkable.  Work-up was started in triage including chest x-ray which does show minimal right middle lobe subsegmental atelectasis.  Troponin of 5.  CBC without leukocytosis.  BMP does show a glucose of 76 with slight hyponatremia of 131 and bicarb of 21.  No gap.  Patient without history of diabetes however we will plan for repeat CBG at this time.  On exam she is noted to  have wheezing throughout.  Given history of COPD will provide Solu-Medrol and breathing treatment with reevaluation.  Patient does complain of pleuritic type chest pain given she is hypoxic at this time I do feel she requires CTA to rule out PE/other abnormality.  If unable to  wean patient off oxygen she will require admission.  ETOH elevated at 323 COVID and flu negative  CTA: IMPRESSION:  1. No evidence for pulmonary embolism.  2. 3.4 cm rounded low-attenuation area in the posterior mediastinum  adjacent to the esophagus, indeterminate. Findings may represent a  bronchogenic cyst. Differential diagnosis would include pericardial  cyst, lymphangioma, esophageal duplication cyst. This can be further  evaluated with MRI as clinically warranted.  3. Atelectasis in the right middle lobe and lingula.   On reevaluation pt resting comfortably. She has been weaned off of her O2 and her oxygen has remained at 93% with good pleth. Less wheezing on exam. She has been ambulated successfully without assistance. Will discharge home at this time with prednisone and augmentin for likely COPD exacerbation. Pt instructed to follow up with her pulmonologist for further eval. She has also been updated on CT scan findings and will follow up with her PCP regarding this/possible MRI in the outpatient setting. Pt in agreement with plan and stable for discharge home at this time.   Problems Addressed: Alcoholic intoxication without complication ([place]): acute illness or injury COPD exacerbation ([place]): acute illness or injury  Amount and/or Complexity of Data Reviewed Labs: ordered. Radiology: ordered.  Risk Prescription drug management.          Final Clinical Impression(s) / ED Diagnoses Final diagnoses:  COPD exacerbation ([place])  Alcoholic intoxication without complication ([place])    Rx / DC Orders ED Discharge Orders          Ordered    amoxicillin-clavulanate (AUGMENTIN) 875-125 MG tablet  Every  12 hours        12/22/21 2316    predniSONE (DELTASONE) 20 MG tablet  Daily        12/22/21 2316             Discharge Instructions      Please pick up  medications and take as prescribed. Follow up with your pulmonologist for further evaluation of your ED visit today. Continue using your inhalers as indicated. You can take Ibuprofen and Tylenol as needed for pain.   Your CT scan did show an area near your esophagus that may represent a cyst of some kind. Follow up with your PCP for further eval as you may need an MRI in the near future for better visualization.   Return to the ED for any new/worsening symptoms        Eustaquio Maize, Hershal Coria 12/22/21 2322    Regan Lemming, MD 12/23/21 [phone]

## 2021-12-22 NOTE — ED Notes (Signed)
Patient called x3 with  no response

## 2021-12-22 NOTE — ED Triage Notes (Addendum)
Pt BIB [facility] from home c/o CP and palpitations that started this morning. Pt also endorses SHOB. EMS gave 324 of ASA and 0.4 of Nitro and a Duoneb treatment. Pt does have ETOH on board. Pt 83% on RA. Pt placed on 3L [place] now sating 92%.

## 2021-12-22 NOTE — ED Notes (Signed)
RN reviewed discharge instructions with pt. Pt verbalized understanding and had no further questions. VSS upon discharge.  

## 2021-12-22 NOTE — ED Notes (Signed)
Pt refused CT, stated she needed her xanax. Alroy Bailiff PA made aware

## 2021-12-22 NOTE — ED Notes (Signed)
Patient transported to CT 

## 2022-10-23 IMAGING — CT CT ANGIO CHEST
2 of 6 series · 18 of 36 positions shown · IV contrast (agent unspecified)
Comparison: Chest x-ray same day.

CLINICAL DATA: Chest pain and palpitations.

EXAM:
CT ANGIOGRAPHY CHEST WITH CONTRAST
TECHNIQUE: Multidetector CT imaging of the chest was performed using the
standard protocol during bolus administration of intravenous
contrast. Multiplanar CT image reconstructions and MIPs were
obtained to evaluate the vascular anatomy.

[Series 7: pe thins · axial · 0.75mm/px · z∈[+1078,+1316]mm · 17 of 378 slices shown]
[im 19/378  lung]
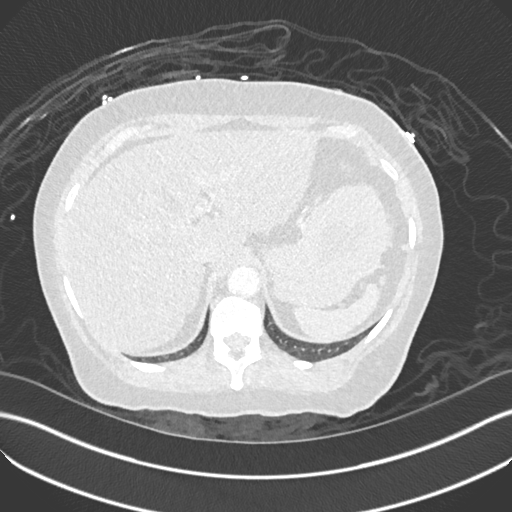
[im 38/378  mediastinal]
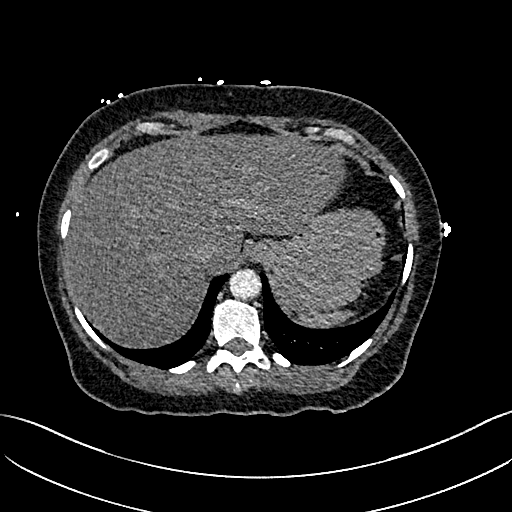
[im 57/378  lung]
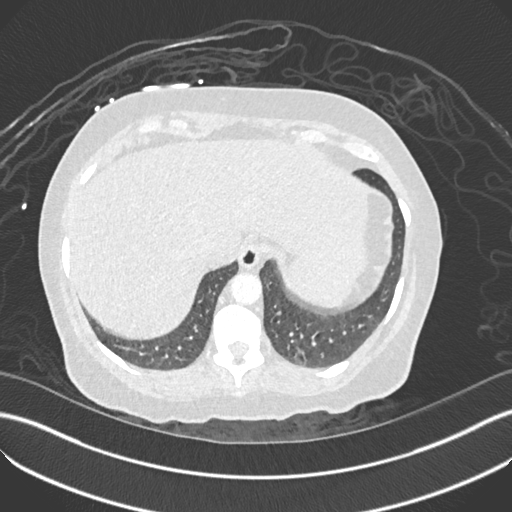
[im 76/378  mediastinal]
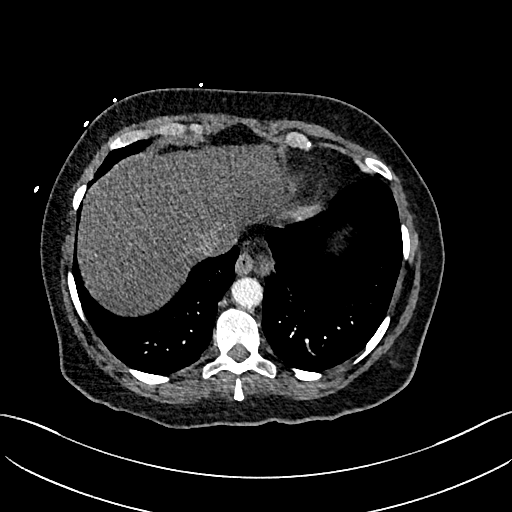
[im 114/378  lung]
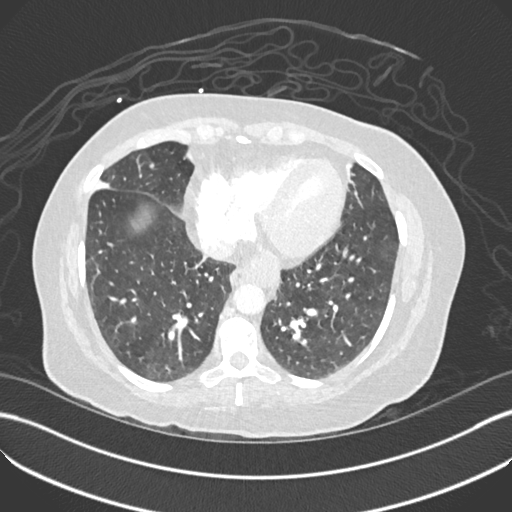
[im 132/378  mediastinal]
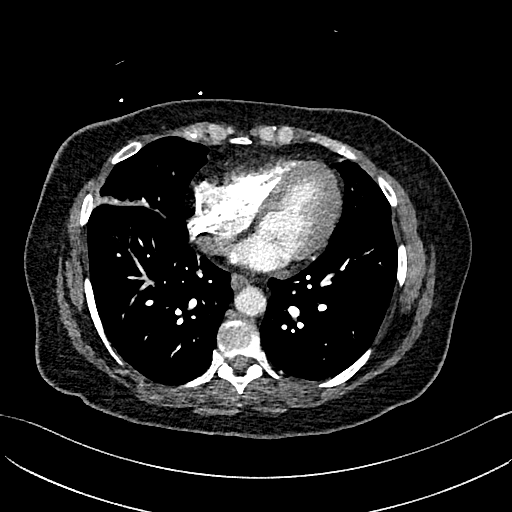
[im 151/378  lung]
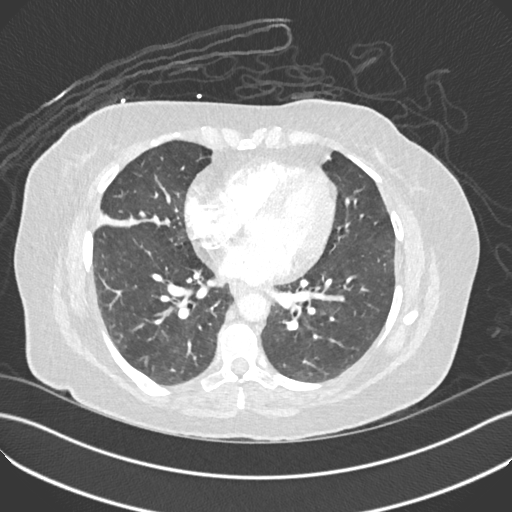
[im 170/378  mediastinal]
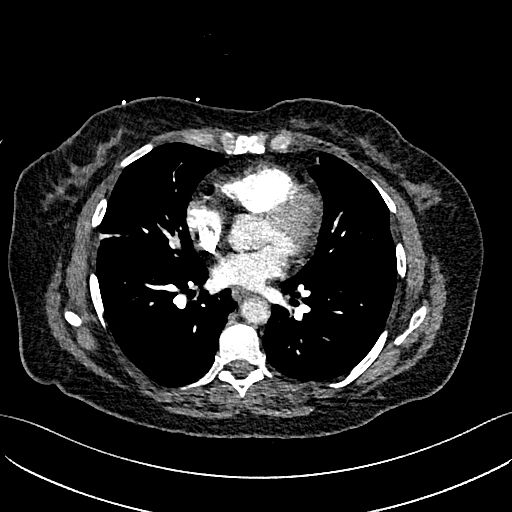
[im 189/378  lung]
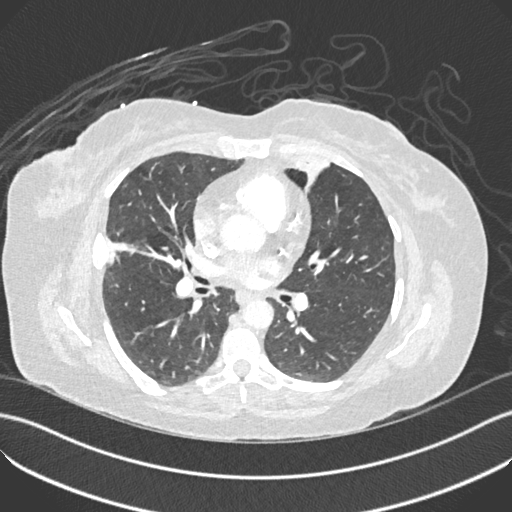
[im 208/378  mediastinal]
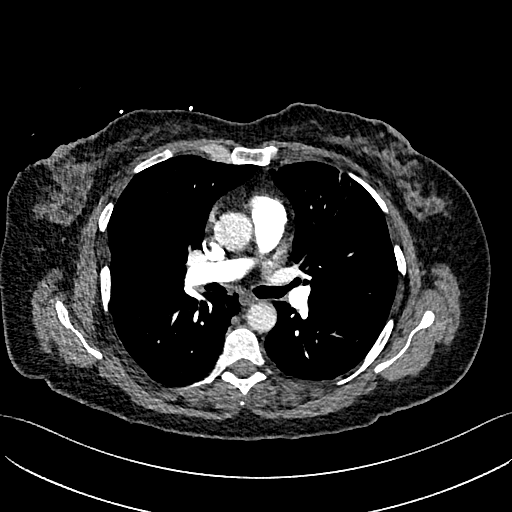
[im 227/378  lung]
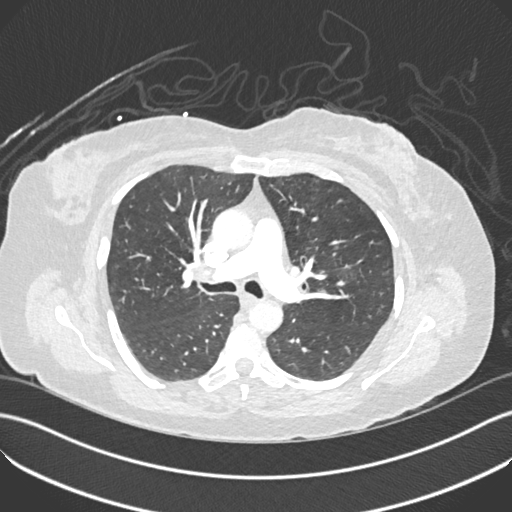
[im 246/378  mediastinal]
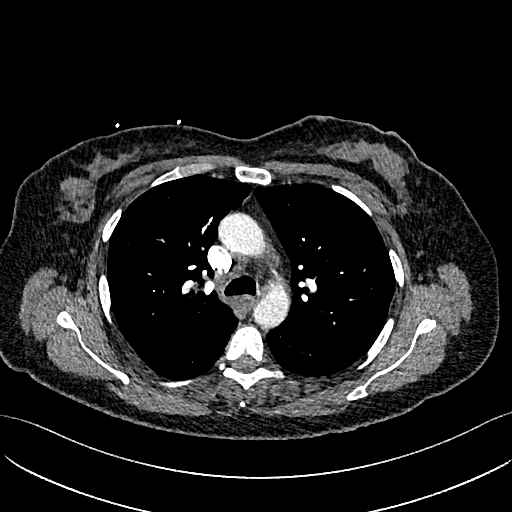
[im 264/378  lung]
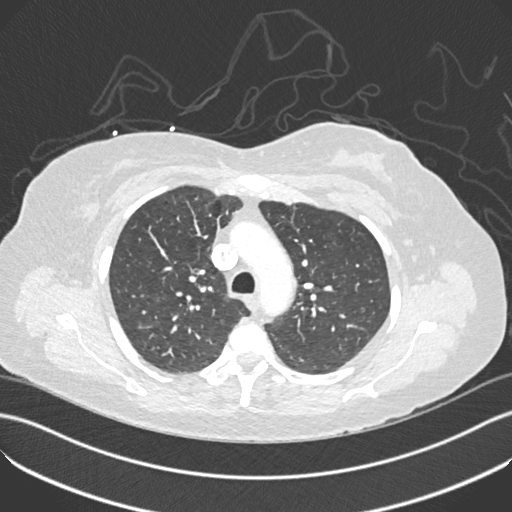
[im 302/378  mediastinal]
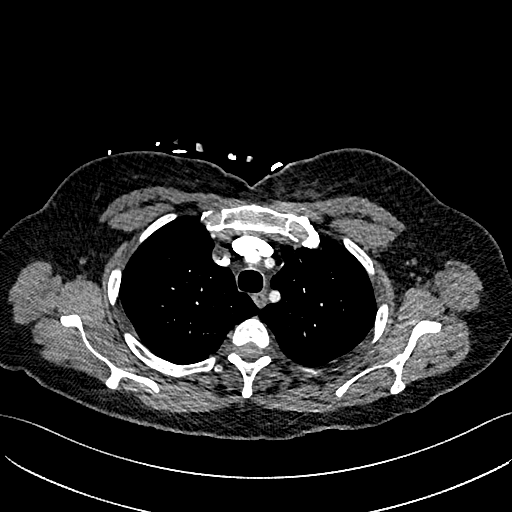
[im 321/378  lung]
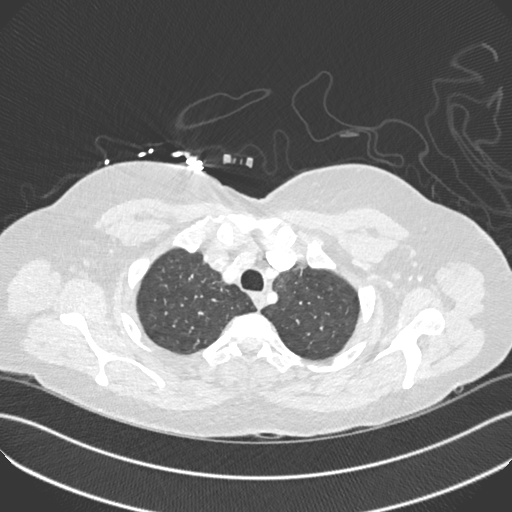
[im 340/378  mediastinal]
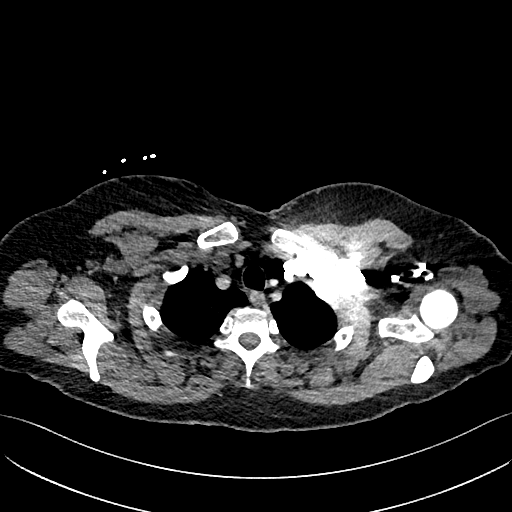
[im 359/378  lung]
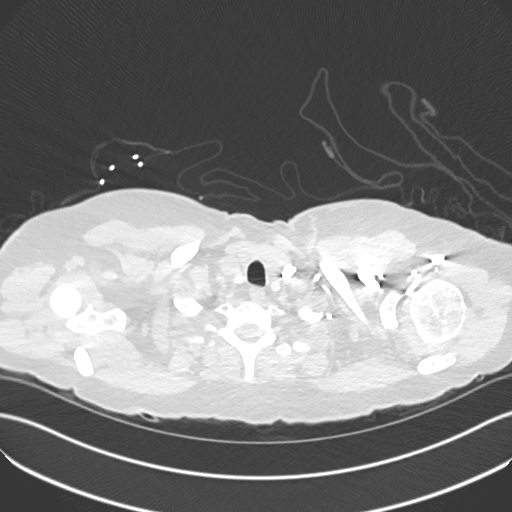

[Series 8: pe 2mm cor · coronal · 0.53mm/px · 1 of 125 slices shown]
[im 63/125  mediastinal]
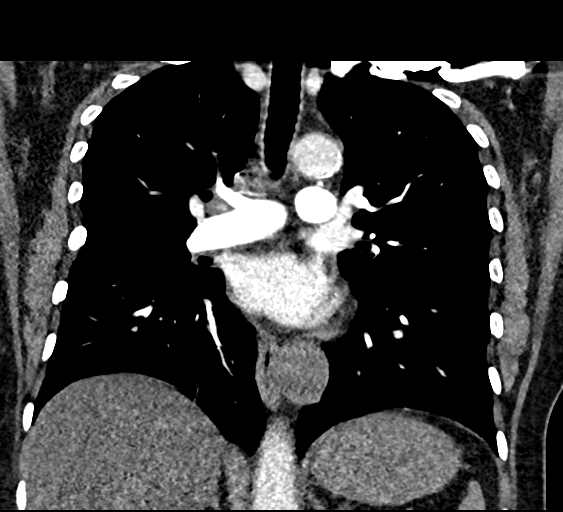

[18 of 36 positions shown; findings below may reference images not displayed]

RADIATION DOSE REDUCTION: This exam was performed according to the
departmental dose-optimization program which includes automated
exposure control, adjustment of the mA and/or kV according to
patient size and/or use of iterative reconstruction technique.

CONTRAST:  80mL OMNIPAQUE IOHEXOL 350 MG/ML SOLN
FINDINGS: Cardiovascular: Satisfactory opacification of the pulmonary arteries
to the segmental level. No evidence of pulmonary embolism. Normal
heart size. No pericardial effusion.

Mediastinum/Nodes: There is a well-circumscribed low-attenuation
area in the posterior mid mediastinum adjacent to the left side of
the esophagus measuring 3.0 x 3.4 x 3.3 cm. There is mild esophageal
deviation to the right. Esophagus is otherwise within normal limits.
There is no surrounding inflammation. There is no pneumomediastinum.
No other enlarged lymph nodes are identified. Visualized thyroid
gland is within normal limits.

Lungs/Pleura: There is a band of atelectasis or scarring in the
right middle lobe and lingula. The lungs are otherwise clear. No
pleural effusion or pneumothorax. Trachea and central airways are
patent.

Upper Abdomen: No acute abnormality.

Musculoskeletal: There are healed right sixth seventh and eighth rib
fractures. No acute fractures are seen.

Review of the MIP images confirms the above findings.
IMPRESSION: 1. No evidence for pulmonary embolism.
2. 3.4 cm rounded low-attenuation area in the posterior mediastinum
adjacent to the esophagus, indeterminate. Findings may represent a
bronchogenic cyst. Differential diagnosis would include pericardial
cyst, lymphangioma, esophageal duplication cyst. This can be further
evaluated with MRI as clinically warranted.
3. Atelectasis in the right middle lobe and lingula.

## 2022-10-23 IMAGING — DX DG CHEST 2V
2 series · 2 of 2 positions shown · non-contrast
Comparison: November 19, 2019.

CLINICAL DATA: Chest pain.

EXAM:
CHEST - 2 VIEW

[chest lat]
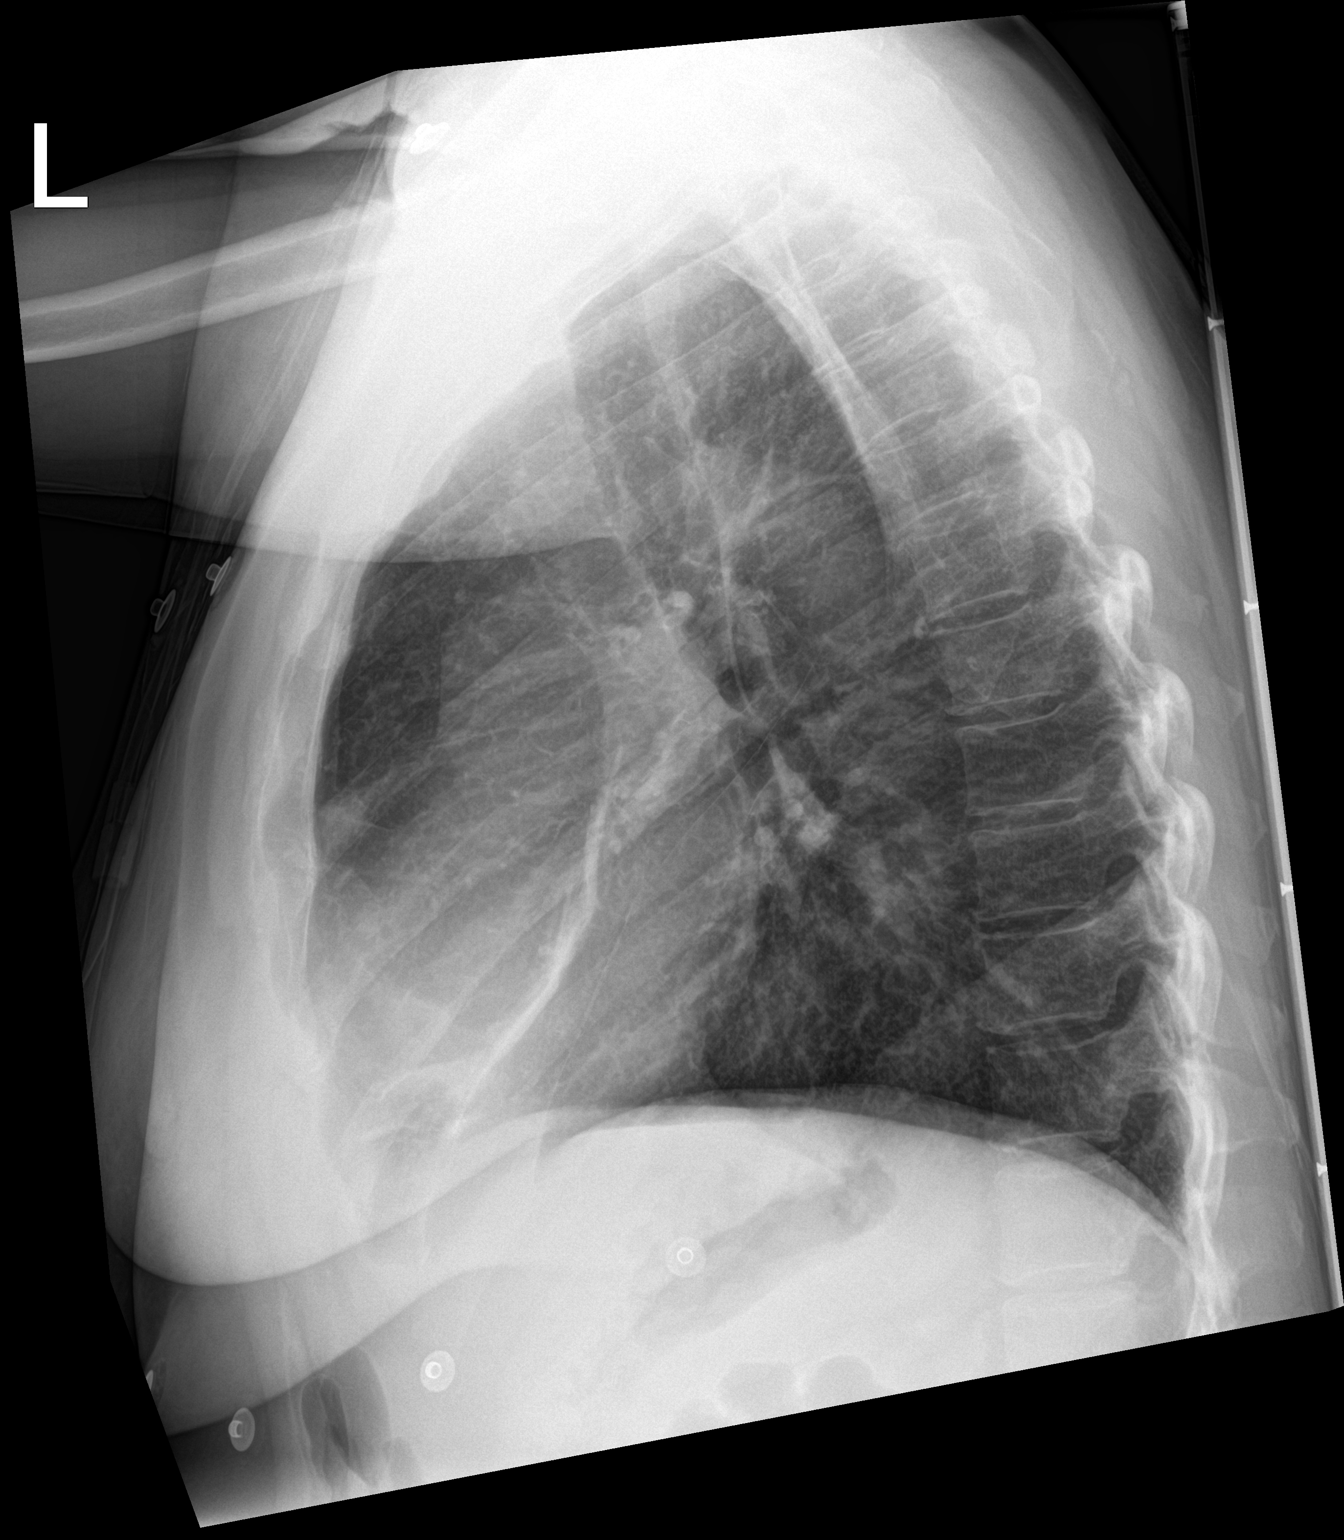

[chest ap]
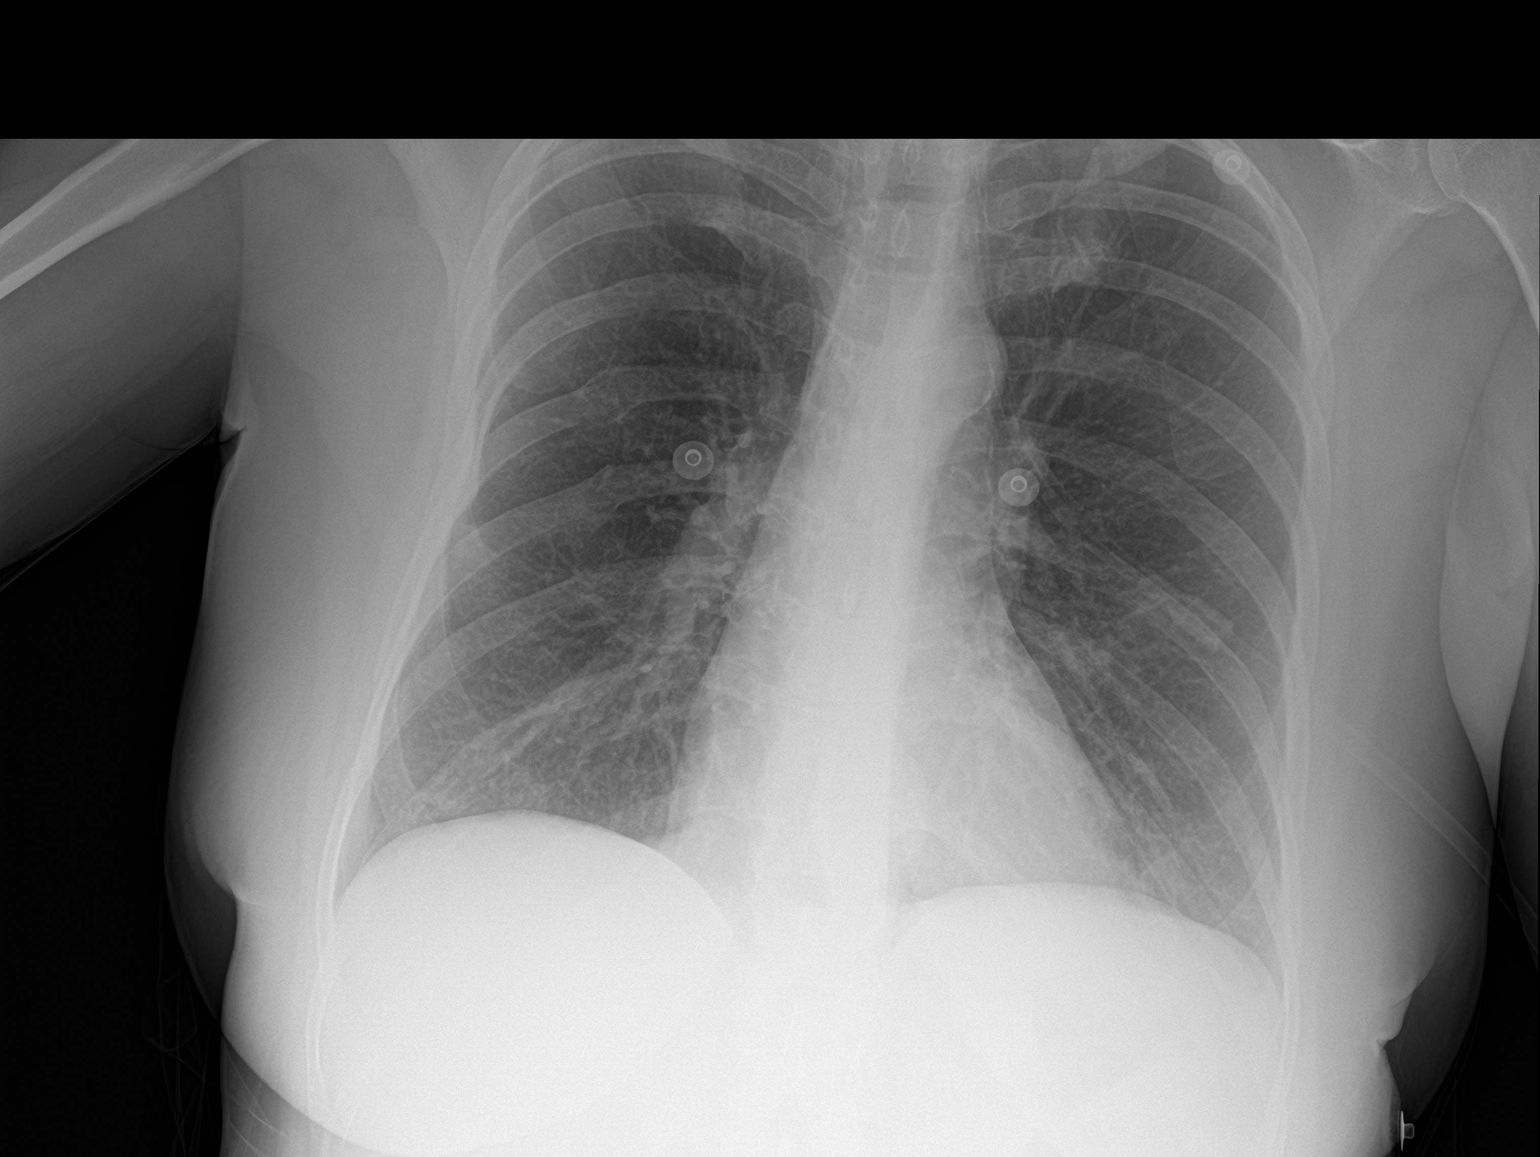

[2 of 2 positions shown; findings below may reference images not displayed]

FINDINGS: The heart size and mediastinal contours are within normal limits.
Minimal right middle lobe subsegmental atelectasis is noted. Left
lung is clear. The visualized skeletal structures are unremarkable.
IMPRESSION: Minimal right middle lobe subsegmental atelectasis.
